# Patient Record
Sex: Female | Born: 1965 | Race: White | Hispanic: No | Marital: Married | Smoking: Never smoker
Health system: Southern US, Community
[De-identification: ages and names within clinical notes are randomized; demographics above are authoritative.]

## PROBLEM LIST (undated history)

## (undated) DIAGNOSIS — N201 Calculus of ureter: Secondary | ICD-10-CM

## (undated) DIAGNOSIS — Z85828 Personal history of other malignant neoplasm of skin: Secondary | ICD-10-CM

## (undated) DIAGNOSIS — R112 Nausea with vomiting, unspecified: Secondary | ICD-10-CM

## (undated) DIAGNOSIS — R519 Headache, unspecified: Secondary | ICD-10-CM

## (undated) DIAGNOSIS — N200 Calculus of kidney: Secondary | ICD-10-CM

## (undated) DIAGNOSIS — Z87442 Personal history of urinary calculi: Secondary | ICD-10-CM

## (undated) DIAGNOSIS — N12 Tubulo-interstitial nephritis, not specified as acute or chronic: Secondary | ICD-10-CM

## (undated) DIAGNOSIS — Z9889 Other specified postprocedural states: Secondary | ICD-10-CM

## (undated) DIAGNOSIS — Z860100 Personal history of colon polyps, unspecified: Secondary | ICD-10-CM

---

## 1999-06-16 ENCOUNTER — Other Ambulatory Visit: Admission: RE | Admit: 1999-06-16 | Discharge: 1999-06-16 | Payer: Self-pay | Admitting: Obstetrics and Gynecology

## 2000-07-04 ENCOUNTER — Other Ambulatory Visit: Admission: RE | Admit: 2000-07-04 | Discharge: 2000-07-04 | Payer: Self-pay | Admitting: Obstetrics and Gynecology

## 2001-01-28 ENCOUNTER — Other Ambulatory Visit: Admission: RE | Admit: 2001-01-28 | Discharge: 2001-01-28 | Payer: Self-pay | Admitting: Obstetrics and Gynecology

## 2001-04-18 ENCOUNTER — Encounter: Payer: Self-pay | Admitting: *Deleted

## 2001-04-18 ENCOUNTER — Encounter: Admission: RE | Admit: 2001-04-18 | Discharge: 2001-04-18 | Payer: Self-pay | Admitting: *Deleted

## 2001-05-05 ENCOUNTER — Encounter: Admission: RE | Admit: 2001-05-05 | Discharge: 2001-05-05 | Payer: Self-pay | Admitting: Urology

## 2001-05-05 ENCOUNTER — Encounter: Payer: Self-pay | Admitting: Urology

## 2001-08-15 ENCOUNTER — Inpatient Hospital Stay (HOSPITAL_COMMUNITY): Admission: AD | Admit: 2001-08-15 | Discharge: 2001-08-17 | Payer: Self-pay | Admitting: Obstetrics and Gynecology

## 2001-08-15 ENCOUNTER — Encounter (INDEPENDENT_AMBULATORY_CARE_PROVIDER_SITE_OTHER): Payer: Self-pay

## 2001-09-23 ENCOUNTER — Other Ambulatory Visit: Admission: RE | Admit: 2001-09-23 | Discharge: 2001-09-23 | Payer: Self-pay | Admitting: Obstetrics and Gynecology

## 2001-11-05 ENCOUNTER — Encounter: Admission: RE | Admit: 2001-11-05 | Discharge: 2001-11-05 | Payer: Self-pay | Admitting: Urology

## 2001-11-05 ENCOUNTER — Encounter: Payer: Self-pay | Admitting: Urology

## 2001-11-06 ENCOUNTER — Encounter: Payer: Self-pay | Admitting: Urology

## 2001-11-06 ENCOUNTER — Encounter: Admission: RE | Admit: 2001-11-06 | Discharge: 2001-11-06 | Payer: Self-pay | Admitting: Urology

## 2001-11-10 ENCOUNTER — Ambulatory Visit (HOSPITAL_BASED_OUTPATIENT_CLINIC_OR_DEPARTMENT_OTHER): Admission: RE | Admit: 2001-11-10 | Discharge: 2001-11-10 | Payer: Self-pay | Admitting: Urology

## 2001-11-10 ENCOUNTER — Encounter: Payer: Self-pay | Admitting: Urology

## 2001-11-13 ENCOUNTER — Encounter: Admission: RE | Admit: 2001-11-13 | Discharge: 2001-11-13 | Payer: Self-pay | Admitting: Urology

## 2001-11-13 ENCOUNTER — Encounter: Payer: Self-pay | Admitting: Urology

## 2001-12-18 ENCOUNTER — Encounter: Payer: Self-pay | Admitting: Urology

## 2001-12-18 ENCOUNTER — Encounter: Admission: RE | Admit: 2001-12-18 | Discharge: 2001-12-18 | Payer: Self-pay | Admitting: Urology

## 2002-03-05 ENCOUNTER — Encounter: Payer: Self-pay | Admitting: Urology

## 2002-03-05 ENCOUNTER — Ambulatory Visit (HOSPITAL_BASED_OUTPATIENT_CLINIC_OR_DEPARTMENT_OTHER): Admission: RE | Admit: 2002-03-05 | Discharge: 2002-03-05 | Payer: Self-pay | Admitting: Urology

## 2002-03-19 ENCOUNTER — Encounter: Payer: Self-pay | Admitting: Urology

## 2002-03-19 ENCOUNTER — Encounter: Admission: RE | Admit: 2002-03-19 | Discharge: 2002-03-19 | Payer: Self-pay | Admitting: Urology

## 2002-05-18 ENCOUNTER — Emergency Department (HOSPITAL_COMMUNITY): Admission: EM | Admit: 2002-05-18 | Discharge: 2002-05-18 | Payer: Self-pay | Admitting: Emergency Medicine

## 2002-08-05 ENCOUNTER — Other Ambulatory Visit: Admission: RE | Admit: 2002-08-05 | Discharge: 2002-08-05 | Payer: Self-pay | Admitting: Obstetrics and Gynecology

## 2002-10-19 HISTORY — PX: HYSTEROSCOPY WITH D & C: SHX1775

## 2003-02-12 ENCOUNTER — Inpatient Hospital Stay (HOSPITAL_COMMUNITY): Admission: AD | Admit: 2003-02-12 | Discharge: 2003-02-14 | Payer: Self-pay | Admitting: Obstetrics and Gynecology

## 2003-03-12 ENCOUNTER — Other Ambulatory Visit: Admission: RE | Admit: 2003-03-12 | Discharge: 2003-03-12 | Payer: Self-pay | Admitting: Obstetrics and Gynecology

## 2005-10-18 ENCOUNTER — Ambulatory Visit (HOSPITAL_COMMUNITY): Admission: RE | Admit: 2005-10-18 | Discharge: 2005-10-18 | Payer: Self-pay | Admitting: *Deleted

## 2005-10-18 ENCOUNTER — Encounter (INDEPENDENT_AMBULATORY_CARE_PROVIDER_SITE_OTHER): Payer: Self-pay | Admitting: *Deleted

## 2006-09-11 ENCOUNTER — Encounter (INDEPENDENT_AMBULATORY_CARE_PROVIDER_SITE_OTHER): Payer: Self-pay | Admitting: Specialist

## 2006-09-11 ENCOUNTER — Ambulatory Visit (HOSPITAL_COMMUNITY): Admission: RE | Admit: 2006-09-11 | Discharge: 2006-09-11 | Payer: Self-pay | Admitting: Obstetrics and Gynecology

## 2006-09-11 HISTORY — PX: OTHER SURGICAL HISTORY: SHX169

## 2008-03-03 ENCOUNTER — Ambulatory Visit (HOSPITAL_COMMUNITY): Admission: RE | Admit: 2008-03-03 | Discharge: 2008-03-03 | Payer: Self-pay | Admitting: Urology

## 2008-03-03 HISTORY — PX: OTHER SURGICAL HISTORY: SHX169

## 2008-03-11 ENCOUNTER — Ambulatory Visit (HOSPITAL_COMMUNITY): Admission: RE | Admit: 2008-03-11 | Discharge: 2008-03-11 | Payer: Self-pay | Admitting: Urology

## 2010-11-28 NOTE — Op Note (Signed)
NAME:  Leslie Fitzgerald, Leslie Fitzgerald                 ACCOUNT NO.:  [id number]   MEDICAL RECORD NO.:  [id number]          PATIENT TYPE:  AMB   LOCATION:  DAY                          FACILITY:  [facility]   PHYSICIAN:  Valetta Fuller, M.D.  DATE OF BIRTH:  1966-07-02   DATE OF PROCEDURE:  03/03/2008  DATE OF DISCHARGE:                               OPERATIVE REPORT   PREOPERATIVE DIAGNOSIS:  Left ureteral stone x2.   POSTOPERATIVE DIAGNOSIS:  Left ureteral stone x2.   PROCEDURE:  1. Cystourethroscopy.  2. Left retrograde pyelogram.  3. Intraoperative fluoroscopy with interpretation.  4. Left semi-rigid ureteroscopic stone manipulation with laser      lithotripsy.  5. Placement of left 6 x 24 contoured double-J stent.   ATTENDING PHYSICIAN:  Dr. Barron Alvine.   RESIDENT PHYSICIAN:  Dr. Delman Kitten.   ANESTHESIA:  General.   INDICATIONS FOR PROCEDURE:  Leslie Fitzgerald is a 45 year old white female  with past medical history positive for nephrolithiasis.  She has had  ESWL in the past but has never undergone an endoscopic therapy.  She  presented to Dr. Archer Asa a weeks ago complaining of left-sided flank  pain.  CT at that time demonstrated bilateral renal calculi with severe  left hydroureteronephrosis.  She has an 8 x 12-mm left UPJ stone as well  as a possible left 5 x 6-mm mid ureteral stone.  Because of these  findings and her symptoms, a combination of endoscopic evaluation with  possible ESWL was recommended.  Preoperatively, risks, benefits,  consequences and concerns were discussed with her, and informed consent  was obtained.   PROCEDURE IN DETAIL:  The patient was brought to the operating room and  placed in supine position.  She was correctly identified by wristband,  and appropriate time-out was taken.  IV antibiotics were administered,  and general anesthesia was delivered.  Once adequately anesthetized, she  was placed in dorsal lithotomy position with great care taken to  minimize  the risk of peripheral neuropathy or compartment syndrome.  Her  perineum was prepped and draped sterilely.  We began our procedure by  performing rigid cystourethroscopy with 22-French rigid cystoscopic  sheath and 12 degrees lens and normal saline as our irrigant.  Urethra  was normal in course and caliber.  Upon entering the bladder, clear  urine was identified.  Both ureteral orifices were noted to be normal in  anatomic position, effluxing clear urine.  There were no stones in the  bladder, and we did not appreciate any urothelial abnormality.  The left  ureteral orifice was gently cannulated with a 5-French open-ended  catheter, and left retrograde pyelogram demonstrated a filling defect at  the UVJ as well as a large filling defect in the left mid ureter.  The  left renal pelvis did not opacify very well.  Simple fluoroscopy  demonstrated the mid ureteral stone, and it is believed that this is the  UPJ stone that has migrated distally.  Based on the retrograde finding,  we elected to proceed forth with ureteroscopic evaluation of the distal  ureter with possible removal  of stone if present.  We then advanced a  sensor tip guidewire in the left ureteral orifice and directed up to the  level of the left renal pelvis using fluoroscopic guidance.  The  guidewire did meet some resistance at the level of the mid ureter where  the known stone was.  The ureter had somewhat of a kink in it, but  advancing the open-ended catheter over the guidewire helping stabilize  the distal ureter, this allowed [place] to easily advance the sensor tip  guidewire into the left renal pelvis.  The open-ended catheter was  removed as was the cystoscope.  We then performed left semi-rigid  ureteroscopy.  The distal ureter was widely capacious with the exception  the UVJ where it was somewhat narrow.  The distal 5- to 6-mm stone was  easily visualized and initially was grasped with a nitinol basket in an  attempt to  remove it en block.  However, due to this narrowing at the  UVJ, we elected to disengage the stone and advance a 360-nanometer laser  fiber through the semi-rigid ureteroscope with settings at 6 Hz and 0.6  joules.  We performed laser lithotripsy of the stone and broke it into  multiple small fragments which were then able to pass in antegrade  fashion into the bladder.  There were 2 larger fragments, and these were  grasped with the nitinol basket and removed.  The distal ureter was  clean at this point.  We then removed the ureteroscope and then back  loaded the cystoscope over the guidewire and successfully placed a left  6 x 24 contoured double-J stent with the proximal curl being in the left  renal pelvis and distal curl noted to be in the bladder.  Upon removal  of the guidewire, urine was seen using the vents from the stent  indicating function.  The bladder was then drained, the cystoscope was  removed, and Urojet was instilled per urethra.  This marked the end of  our procedure.  She tolerated the procedure well.  There were no  complications.  She awoke from anesthesia and was taken to recovery room  in stable condition.  Dr. Isabel Caprice was present and participated in all  aspects of the case.     ______________________________  Justice Rocher, M.D.  Electronically Signed    DW/MEDQ  D:  03/03/2008  T:  03/03/2008  Job:  [id number]

## 2010-12-01 NOTE — Discharge Summary (Signed)
   NAME:  Leslie Fitzgerald, Leslie Fitzgerald                           ACCOUNT NO.:  [id number]   MEDICAL RECORD NO.:  [id number]                   PATIENT TYPE:  INP   LOCATION:  9113                                 FACILITY:  [facility]   PHYSICIAN:  Lenoard Aden, M.D.             DATE OF BIRTH:  [birth year]   DATE OF ADMISSION:  02/12/2003  DATE OF DISCHARGE:  02/14/2003                                 DISCHARGE SUMMARY   HOSPITAL COURSE:  The patient underwent uncomplicated primary C-section for  a nonreassuring fetal heart tracing on February 12, 2003.  Postoperative course  uncomplicated.  Hemoglobin 9.4.  Discharged to home day #2.  Tolerated a  regular diet without difficulty, ambulated without difficulty.  Discharged  to home on Tylox and ferrous sulfate.  Discharge teaching done.  To follow  up in the office in one week for staple removal.                                               Lenoard Aden, M.D.    RJT/MEDQ  D:  03/01/2003  T:  03/01/2003  Job:  [id number]

## 2010-12-01 NOTE — H&P (Signed)
[facility]  Patient:    Leslie Fitzgerald, Leslie Fitzgerald Visit Number: [id number] MRN: [id number]          Service Type: OBS Location: 910B 9162 01 Attending Physician:  Maxie Better Dictated by:   Lenoard Aden, M.D. Admit Date:  08/15/2001   CC:         [facility]   History and Physical  CHIEF COMPLAINT:              Labor.  HISTORY OF PRESENT ILLNESS:   This is a 45 year old white female, G4 P2, EDD of August 17, 2001, at 39+ weeks, with polyhydramnios, for induction.  ALLERGIES:                    No known drug allergies.  MEDICATIONS:                  Prenatal vitamins.  PAST MEDICAL HISTORY:         1. SAB in 1994.                               2. Full term delivery, female, in 34.                               3. Female in 32.                               4. No other medical or surgical hospitalizations                                  except for kidney stones in pregnancy.                               5. Patient has history of LEEP in 1994.  FAMILY HISTORY:               Hypertension and heart disease.  PRENATAL LABORATORY DATA:     Blood type Rh-positive.  Rubella immune. Hepatitis B surface antigen negative.  HIV nonreactive.  REVIEW OF SYSTEMS:            Pregnancy complicated by preterm cervical change.  PHYSICAL EXAMINATION:  GENERAL:                      She is a well-developed, well-nourished white female, in no apparent distress.  HEENT:                        Normal.  LUNGS:                        Clear.  HEART:                        Regular rhythm.  ABDOMEN:                      Soft, gravid, nontender.  Estimated fetal weight 7-1/2 pounds.  PELVIC:                       Cervix is 4-5, 100%, vertex  0.  EXTREMITIES:                  No cords.  NEUROLOGIC:                   Nonfocal.  IMPRESSION:                   Term intrauterine pregnancy with polyhydramnios, in early labor, scheduled for  induction.  PLAN:                         Proceed with Pitocin augmentation as needed. Anticipated attempted vaginal delivery. Dictated by:   Lenoard Aden, M.D. Attending Physician:  Maxie Better DD:  08/16/01 TD:  08/16/01 Job: [id number]

## 2010-12-01 NOTE — Op Note (Signed)
NAME:  Leslie Fitzgerald, Leslie Fitzgerald                 ACCOUNT NO.:  [id number]   MEDICAL RECORD NO.:  [id number]          PATIENT TYPE:  AMB   LOCATION:  SDC                           FACILITY:  [facility]   PHYSICIAN:  Lenoard Aden, M.D.DATE OF BIRTH:  1966-01-18   DATE OF PROCEDURE:  09/11/2006  DATE OF DISCHARGE:                               OPERATIVE REPORT   PREOPERATIVE DIAGNOSIS:  Refractory menorrhagia.   POSTOPERATIVE DIAGNOSIS:  Refractory menorrhagia.   PROCEDURE:  Diagnostic hysteroscopy, dilatation and curettage, and  NovaSure endometrial ablation.   SURGEON:  Lenoard Aden, M.D.   ANESTHESIA:  General.   ESTIMATED BLOOD LOSS:  Less than 50 mL.   FLUID DEFICIT:  Less than 50 mL.   COMPLICATIONS:  None.   PATHOLOGY SPECIMEN:  Endometrial curettings to pathology.   The patient to recovery in good condition.   BRIEF OPERATIVE NOTE:  After being apprised of the risks of anesthesia,  infection, bleeding, injury to abdominal organs and need for repair,  delayed versus immediate complications to include bowel and bladder  injury with possible need for repair of uterine perforation, the patient  was brought to the operating room, where she was administered a general  anesthetic without complications, prepped and draped in the usual  sterile fashion.  Feet are placed in the yellow fin stirrups.  Exam  under anesthesia reveals a small midpositioned, anteflexed uterus.  No  adnexal masses are noted.  Dilute paracervical block with 2% Xylocaine  solution 20 mL placed.  Intracervical length of 4 cm.  Uterus sounds to  7 cm.  NovaSure device is set.  Hysteroscope is placed after dilating  the cervix up to a #23 Pratt dilator.  Visualization reveals a normal  endometrial cavity, bilateral normal uterine tubal ostia, and no  evidence of any lesions.  D&C is performed using sharp curettage in a  four-quadrant method.  Endometrial curettings are collected and sent to  pathology.  NovaSure  device is placed and is seated in the proper  fashion, set to the cavity width of 3.5.  CO2 test is negative, power is  set to 58 and procedure proceeds for 118 seconds without  difficulty in the standard fashion.  Revisualization hysteroscopically  reveals the cavity to be intact without evidence of perforation and well-  ablated globally.  At this time all instruments are removed.  Minimal  bleeding is noted.  The patient tolerates the procedure well  and is  transferred to recovery in good condition.      Lenoard Aden, M.D.  Electronically Signed     RJT/MEDQ  D:  09/11/2006  T:  09/11/2006  Job:  [id number]

## 2010-12-01 NOTE — H&P (Signed)
   NAME:  Leslie Fitzgerald, Leslie Fitzgerald                           ACCOUNT NO.:  [id number]   MEDICAL RECORD NO.:  [id number]                   PATIENT TYPE:  MAT   LOCATION:  MATC                                 FACILITY:  [facility]   PHYSICIAN:  Lenoard Aden, M.D.             DATE OF BIRTH:  08/24/1965   DATE OF ADMISSION:  02/12/2003  DATE OF DISCHARGE:                                HISTORY & PHYSICAL   CHIEF COMPLAINT:  Polyhydramnios.   HISTORY OF PRESENT ILLNESS:  The patient is a 45 year old white female, G5,  P3, EDD of February 27, 2003, at 38 weeks with worsening polyhydramnios with  an AFI of 32, for induction.  The patient's estimated fetal weight is in the  96th percentile as well.   PAST OBSTETRICAL HISTORY:  Remarkable for four previous pregnancies, an SAB  in 1994, SVD in 1995, 1998, and 2003.   ALLERGIES:  No known drug allergies.   MEDICATIONS:  Prenatal vitamins.   She has a history of urolithiasis.   Family history of hypertension and cardiovascular disease.   PRENATAL LABORATORY DATA:  Blood type of O positive, Rh antibody negative.  Rubella immune.  Hepatitis negative.  HIV declined.  Pregnancy course  complicated by preterm cervical change.  A normal amniocentesis for advanced  maternal age.   PHYSICAL EXAMINATION:  GENERAL:  She is a well-developed, well-nourished  white female in no acute distress.  HEENT:  Normal.  CHEST:  Lungs clear.  CARDIAC:  Regular rhythm.  ABDOMEN:  Soft, gravid, nontender.  Estimated fetal weight 8-1/2 pounds.  PELVIC:  Cervix is 2-3 cm, 90%, vertex, -2.  EXTREMITIES:  No cords.  NEUROLOGIC:  Nonfocal.   IMPRESSION:  Term intrauterine pregnancy with worsening polyhydramnios.   PLAN:  Proceed with induction.  Anticipate attempts at vaginal delivery.                                               Lenoard Aden, M.D.   RJT/MEDQ  D:  02/11/2003  T:  02/12/2003  Job:  [id number]

## 2010-12-01 NOTE — Op Note (Signed)
NAME:  Leslie Fitzgerald, Leslie Fitzgerald                 ACCOUNT NO.:  [id number]   MEDICAL RECORD NO.:  [id number]          PATIENT TYPE:  AMB   LOCATION:  SDC                           FACILITY:  [facility]   PHYSICIAN:  [place] B. Earlene Plater, M.D.  DATE OF BIRTH:  [birth year]   DATE OF PROCEDURE:  10/18/2005  DATE OF DISCHARGE:                                 OPERATIVE REPORT   PREOPERATIVE DIAGNOSIS:  Abnormal uterine bleeding.   POSTOPERATIVE DIAGNOSIS:  Abnormal uterine bleeding.   PROCEDURE:  Hysteroscopy, dilatation and curettage.   SURGEON:  Chester Holstein. Earlene Plater, M.D.   ASSISTANT:  None.   ANESTHESIA:  MAC and 20 mL of 1% Nesacaine paracervical block.   SPECIMENS:  Endometrial curettings.   ESTIMATED BLOOD LOSS:  Minimal.   FLUID DEFICIT:  Was 100 mL Sorbitol.   COMPLICATIONS:  None.   INDICATIONS FOR PROCEDURE:  The patient with a history of heavy and  irregular menstrual bleeding.  Saline infusion and ultrasound in the office  suggestive of a focal endometrial mass.  The patient was advised of the  risks of surgery, including bleeding, damage to tissues and/or organs and  uterine perforation and fluid overload.   FINDINGS:  Generally proliferative-appearing endometrium but no focal mass  seen.   DESCRIPTION OF PROCEDURE:  The patient is taken to the operating room and  MAC anesthesia obtained.  She was placed in the Leonidas stirrups.  Prepped and  draped in the standard fashion.  The bladder was emptied with the in and out  catheter.  Examination under anesthesia showed a normal-sized mid-plane  uterus with no adnexal masses.  The speculum inserted.  A paracervical block  placed.  A single tooth attached to the anterior lip of the cervix.  The  cervix was easily dilated to a number 21.  The diagnostic hysteroscope was  inserted, after being flushed with Sorbitol.  Good uterine distention  obtained.  The endometrial cavity was examined.  The tubal ostia both noted.  The endometrium was very  proliferative in its appearance; however, no focal  mass was seen.  The endometrium was gently curetted and Randall stone  forceps passed with no additional tissue returned.  The scope was re-  inserted and no abnormalities were seen, therefore the procedure was  terminated.  The instruments were removed.  The cervix was hemostatic.   The patient tolerated the procedure well with no complications.  She was  taken to the recovery room awake, alert and in stable condition.      Gerri Spore B. Earlene Plater, M.D.  Electronically Signed     WBD/MEDQ  D:  10/18/2005  T:  10/19/2005  Job:  [id number]

## 2010-12-01 NOTE — Op Note (Signed)
NAME:  Leslie Fitzgerald, Leslie Fitzgerald                           ACCOUNT NO.:  [id number]   MEDICAL RECORD NO.:  [id number]                   PATIENT TYPE:  INP   LOCATION:  9198                                 FACILITY:  [facility]   PHYSICIAN:  Lenoard Aden, M.D.             DATE OF BIRTH:  1966/02/12   DATE OF PROCEDURE:  02/12/2003  DATE OF DISCHARGE:                                 OPERATIVE REPORT   PREOPERATIVE DIAGNOSES:  1. Nonreassuring fetal heart rate tracing.  2. Polyhydramnios at 38 weeks'.   POSTOPERATIVE DIAGNOSES:  1. Nonreassuring fetal heart rate tracing.  2. Polyhydramnios at 38 weeks'.   PROCEDURE:  Primary low segment transverse cesarean section.   SURGEON:  Lenoard Aden, M.D.   ANESTHESIA:  Epidural by Burnett Corrente, M.D.   ESTIMATED BLOOD LOSS:  1,500 mL.   INSTRUMENT COUNT:  Correct.   COMPLICATIONS:  None.   DISPOSITION:  The patient to recovery in good condition.   FINDINGS:  Full-term living female, Apgars 8 and 79.  Cord pH pending.  Normal tubes and ovaries.  High riding bladder flap noted.   BRIEF OPERATIVE NOTE:  After being apprised of the risks of anesthesia,  infection, bleeding, injury to abdominal organs, need for repair, the  patient is brought to the operating room for nonreassuring fetal heart rate  tracing, prepped and draped in usual sterile fashion.  After achieving  adequate anesthesia, a Foley catheter placed.  Dilute Marcaine solution is  placed in the area.  A Pfannenstiel skin incision is made with a scalpel,  carried down to the fascia which is nicked in the midline and open  transversely using Mayo scissors.  Rectus muscles dissected sharply in the  midline.  The peritoneum entered sharply, a bladder blade placed.  Upon  placement of the bladder blade the bladder flap is highly adherent to the  middle part of the lower uterine segment and is dissected sharply off this  lower uterine segment.  A hysterotomy incision is made.  An  atraumatic  delivery of a full-term living female, 8 pounds, is handed to the  pediatricians in attendance.  Occiput anterior position, Apgars 8 and 9.  Cord blood collected.  Cord pH collected.  Placenta delivered manually  intact.  The uterus curetted using a dry lap pack and closed in two layers  using a 0 Monocryl suture.  At this time there are multiple sites of  bleeding.  The right lateral aspect of the incision is closed by two  placements of an O'Leary stitch to control bleeding.  The bladder flap is  continually oozing along its reflection to the lower uterine segment.  This  is cauterized gently for good hemostasis and Surgicel is placed in the  midline.  There is bleeding from an area of the incision which is closed  using an interrupted suture.  The left lateral aspect of the  incision is  within normal limits.  There do not appear to be any  cervical lacerations during this incision.  Normal tubes and ovaries noted.  Good hemostasis achieved.  Irrigation is accomplished.  Fascia then closed  using a 0 Monocryl in continuous running fashion.  Skin closed using  staples.  The patient tolerates the procedure well and is transferred to  recovery in good condition.                                               Lenoard Aden, M.D.    RJT/MEDQ  D:  02/12/2003  T:  02/13/2003  Job:  [id number]

## 2011-03-21 ENCOUNTER — Ambulatory Visit (HOSPITAL_BASED_OUTPATIENT_CLINIC_OR_DEPARTMENT_OTHER)
Admission: RE | Admit: 2011-03-21 | Discharge: 2011-03-21 | Disposition: A | Discharge status: Home / Self Care | Payer: BC Managed Care – PPO | Source: Ambulatory Visit | Attending: Urology | Admitting: Urology

## 2011-03-21 DIAGNOSIS — N201 Calculus of ureter: Secondary | ICD-10-CM | POA: Insufficient documentation

## 2011-03-28 ENCOUNTER — Ambulatory Visit (HOSPITAL_BASED_OUTPATIENT_CLINIC_OR_DEPARTMENT_OTHER)
Admission: RE | Admit: 2011-03-28 | Discharge: 2011-03-28 | Disposition: A | Discharge status: Home / Self Care | Payer: BC Managed Care – PPO | Source: Ambulatory Visit | Attending: Urology | Admitting: Urology

## 2011-03-28 DIAGNOSIS — Z01812 Encounter for preprocedural laboratory examination: Secondary | ICD-10-CM | POA: Insufficient documentation

## 2011-03-28 DIAGNOSIS — N201 Calculus of ureter: Secondary | ICD-10-CM | POA: Insufficient documentation

## 2011-03-28 HISTORY — PX: OTHER SURGICAL HISTORY: SHX169

## 2011-03-28 LAB — POCT HEMOGLOBIN-HEMACUE: Hemoglobin: 14.8 g/dL (ref 12.0–15.0)

## 2011-04-05 NOTE — Op Note (Signed)
  NAME:  Leslie Fitzgerald, Leslie Fitzgerald NO.:  [id number]  MEDICAL RECORD NO.:  [id number]  LOCATION:                               FACILITY:  [facility]  PHYSICIAN:  Valetta Fuller, MD    DATE OF BIRTH:  [birth year]  DATE OF PROCEDURE:  03/28/2011 DATE OF DISCHARGE:                              OPERATIVE REPORT   PREOPERATIVE DIAGNOSIS:  Right ureteral calculus.  POSTOPERATIVE DIAGNOSIS:  Right ureteral calculus.  PROCEDURE PERFORMED: 1. Cystoscopy. 2. Right ureteroscopy. 3. Holmium laser lithotripsy. 4. Basketing of fragments.  SURGEON:  Valetta Fuller, MD  ANESTHESIA:  General.  INDICATIONS:  [name] is 45 years of age.  She has had substantial recurrent nephrolithiasis and was recently diagnosed with the distal right ureteral calculus.  She has been attempting to pass the stone now for quite some time unsuccessfully.  She has continued to have intermittent discomfort and lower urinary symptoms.  We discussed options with her and she elected to proceed with definitive management. She presents now for that.  She appears to understand the advantages and disadvantages of that.  TECHNIQUE AND FINDINGS:  The patient was brought to the operating room where she had successful induction of general anesthesia.  She was placed in lithotomy position and prepped, draped in usual manner.  She received perioperative antibiotics.  Appropriate surgical time-out was performed.  Cystoscopy revealed an inflamed right hemi trigone.  A small portion of the stone could be seen __________ from the right ureteral orifice.  With manipulation, a guidewire was able to get passed beyond the stone to the right renal pelvis with fluoroscopic guidance.  We then inserted the 6.5 French rigid ureteroscope.  We went up to the ureteral orifice where a holmium laser fiber was used to start to fragment the stone.  We then slowly engaged the distal ureter and continue to treat the stone until it had been  broken up into 18-20 pieces.  Several of the larger fragments were basket extracted with a nitinol basket.  There was some mild inflammation of the intramural ureter, but nothing really significant.  No evidence of ureteral injury.  We felt it would be reasonable to attempt to manage her without a double-J stent given the lack of need for dilation and lack of any complications or problems with ureteroscopy. For that reason, the guidewire was removed.  Her bladder was drained and she was brought to recovery room in stable condition.     Valetta Fuller, MD     DSG/MEDQ  D:  03/29/2011  T:  03/29/2011  Job:  [id number]  Electronically Signed by Barron Alvine M.D. on 04/05/2011 04:53:52 PM

## 2012-02-14 ENCOUNTER — Encounter (HOSPITAL_BASED_OUTPATIENT_CLINIC_OR_DEPARTMENT_OTHER): Payer: Self-pay | Admitting: *Deleted

## 2012-02-14 ENCOUNTER — Other Ambulatory Visit: Payer: Self-pay | Admitting: Urology

## 2012-02-15 ENCOUNTER — Encounter (HOSPITAL_BASED_OUTPATIENT_CLINIC_OR_DEPARTMENT_OTHER): Payer: Self-pay | Admitting: *Deleted

## 2012-02-15 NOTE — Progress Notes (Addendum)
NPO AFTER MN. ARRIVES AT 1030. NEEDS ISTAT 8 AND URINE PREG. MAY TAKE VICODIN AM OF SURG W/ SIP OF WATER.  CALLED DR Isabel Caprice OFFICE AND QUESTIONED CBC AND BMET ORDERS, SINCE PT HX DOES NOT SHOW ANY ISSUES.  ASKED IF ISTAT WAS OK OR IF THERE IS SOMETHING IN DR GRAPEY HX THAT I AM NOT AWARE OF, SINCE NO H&P  IN EPIC YET.  RECEIVED FAX FROM  DR Isabel Caprice WITH ANSWER, OK TO DO ISTAT INSTEAD OF CBC/BMT BUT HE DID WANT AN ACCURATE RENAL FUNCTION.  SO, I D/C'D LABS AND ENTERED ISTAT 8 , THIS WILL GET HIM WHAT HE NEEDS.

## 2012-02-17 NOTE — H&P (Signed)
Reason For Visit   Preston returns for a follow-up visit. I have reviewed her most recent imaging studies and went through the note from [facility]. She did have a positive urine culture for Pseudomonas but really had minimal to no significant symptoms other than a little bit of suprapubic pressure. She took the Cipro for 4 or 5 days but then stopped it because of concerns over photosensitivity since she was out in the sun. She continues to be relatively asymptomatic. She has had a bit of discomfort on her left side but nothing that has been severe. The patient had another KUB today. She has 7-9 stones in her distal to mid ureter on the left. I did not see any renal calculi. All these stones appear to be in the 3-5 mm size range.       History of Present Illness        46 y/o white female with h/o nephrolithiasis presents for acute evaluation LUTS onoing for the past several weeks but worse over the past week.  She reports increased frequency, suprapubic pressure and terminal hesitancy as well as intermittent twinges of flank pains.  She deneis persistent flank pain, N/V, fever or chills. She is leaving to go out of town Sat. 01/19/12 and wanted to make sure she did not have a UTI.   She has recenlty passed an 8mm right distal ureteral stone in 07/2011.  On KUB 06/2011, it appeared that she had 1-2 left proximal ureteral stones as well and she is not aware that she passed either of these but has not had any persistent or significant flank pains to suggest obstruction.  She is s/p cystoscopy with HLL and right distal ureteral stone extraction 03/28/11. She has known bilateral nephrolithiasis.   Past Medical History Problems  1. History of  Urinary Calculus 592.9  Surgical History Problems  1. History of  Cesarean Section 2. History of  Cystoscopy With Insertion Of Ureteral Stent Left 3. History of  Cystoscopy With Ureteroscopy With Lithotripsy 4. History of  Cystoscopy With Ureteroscopy With  Lithotripsy 5. History of  Lithotripsy 6. History of  Lithotripsy  Current Meds 1. Maxalt-MLT 10 MG Oral Tablet Dispersible; Therapy: 14Jun2011 to 2. Tamsulosin HCl 0.4 MG Oral Capsule; TAKE 1 CAPSULE Daily to facilitate stone passage;  Therapy: 01Jul2013 to (Evaluate:29Sep2013); Last Rx:01Jul2013  Allergies Medication  1. HYDROmorphone HCl TABS  Family History Problems  1. Maternal history of  Acute Myocardial Infarction V17.3 2. Maternal history of  Death In The Family Mother 3. Paternal history of  Family Health Status - Father's Age age 25 4. Maternal history of  Family Health Status - Mother's Age age 81 from "poor circulation" 5. Family history of  Family Health Status Number Of Children 1 son and 3 daughters  Social History Problems  1. Caffeine Use 2 daily 2. Marital History - Currently Married 3. Never A Smoker 4. 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  Genitourinary: hematuria, but no dysuria.  Gastrointestinal: nausea, flank pain and abdominal pain.  Constitutional: feeling tired (fatigue), but no fever and no night sweats.  Musculoskeletal: back pain.  Neurological: headache.    Vitals Vital Signs [Data Includes: Last 1 Day]  01Aug2013 09:01AM  Blood Pressure: 110 / 77 Temperature: 98.5 F Heart Rate: 76  Physical Exam Constitutional: Well nourished Amended By: Barron Alvine; 02/17/2012  8:33 PMEST  and well developed Amended By: Barron Alvine; 02/17/2012 8:33 PMEST . No acute distress. Amended By: Barron Alvine; 02/17/2012 8:33 PMEST.  ENT:. The ears and nose are normal in appearance. Amended By: Barron Alvine; 02/17/2012 8:33 PMEST.  Pulmonary: No respiratory distress Amended By: Barron Alvine;  02/17/2012 8:34 PMEST  and normal respiratory rhythm and effort Amended By: Barron Alvine; 02/17/2012 8:34 PMEST.  Cardiovascular: Heart rate and rhythm are normal Amended By: Barron Alvine; 02/17/2012 8:33 PMEST . No peripheral edema. Amended By: Barron Alvine; 02/17/2012 8:33 PMEST.  Abdomen: The abdomen is soft and nontender Amended By: Barron Alvine; 02/17/2012 8:33 PMEST No masses are palpated Amended By: Barron Alvine; 02/17/2012 8:33 PMEST No CVA tenderness Amended By: Barron Alvine; 02/17/2012 8:33 PMEST. No hernias are palpable Amended By: Barron Alvine; 02/17/2012 8:33 PMEST No hepatosplenomegaly noted Amended By: Barron Alvine; 02/17/2012 8:33 PMEST  Neuro/Psych:. Mood and affect are appropriate. Amended By: Barron Alvine; 02/17/2012 8:34 PMEST.    Results/Data Urine [Data Includes: Last 1 Day]   01Aug2013  COLOR YELLOW   APPEARANCE CLEAR   SPECIFIC GRAVITY 1.015   pH 6.0   GLUCOSE NEG mg/dL  BILIRUBIN NEG   KETONE NEG mg/dL  BLOOD MOD   PROTEIN NEG mg/dL  UROBILINOGEN 0.2 mg/dL  NITRITE NEG   LEUKOCYTE ESTERASE MOD   SQUAMOUS EPITHELIAL/HPF NONE SEEN   WBC 11-20 WBC/hpf  RBC 0-2 RBC/hpf  BACTERIA RARE   CRYSTALS NONE SEEN   CASTS NONE SEEN    Assessment Assessed  1. Pyuria 791.9 2. Distal Ureteral Stone On The Left 592.1 3. Nephrolithiasis 592.0  Plan Health Maintenance (V70.0)  1. UA With REFLEX  Done: 01Aug2013 08:35AM Pyuria (791.9)  2. Levofloxacin 500 MG Oral Tablet; TAKE 1 TABLET DAILY; Therapy: 01Aug2013 to  (Evaluate:15Aug2013); Last Rx:01Aug2013 3. RENAL U/S LEFT  Done: 01Aug2013 12:00AM 4. URINE CULTURE  Done: 01Aug2013 Ureteral Stone (592.1)  5. Follow-up Schedule Surgery Office  Follow-up  Requested for: 01Aug2013  Discussion/Summary   The real key syndrome issue here for Leslie Fitzgerald is to determining if she has ongoing active infection and then how we are going to manage her substantial left ureteral stone burden. An ultrasound will be done today to  see the degree of hydronephrosis she has. The more hydronephrotic her kidney is, the more urgent I feel it is going to be necessary to get a double-J stent in her and make some attempts at definitive stone management. Obviously, we need to make sure that the infection is under control/resolved before embarking on either ESWL or ureteroscopy. If she does get into clinical problems, then a stent would be the first step. Otherwise, if we can clear her infection and she does well clinically, we can make an attempt at starting with ureteroscopy knowing that it may take multiple procedures to clear her completely. This was all discussed with her. We will get a limited left ultrasound and then plan ahead from there.    A total of 30 minutes were spent in the overall care of the patient today with 30 minutes in direct face to face consultation.    Amendment   Deauna did undergo limited left renal ultrasonography. She was noted to have severe hydronephrosis of the left kidney with a dilated proximal ureter. Again, I do think we are going to need to intervene sooner rather than later. Repeat urine culture done today with initiation of Levaquin. We will plan on next week taking her to surgery for retrograde pyelogram. If she  is doing reasonably well clinically, we can make an initial attempt at ureteroscopy and try to remove a part or potentially all of the stone burden if feasible. Most importantly, however, we need to be sure that we do obtain good drainage of that kidney with double-J stent placement and may need to come back at a later date for more definitive stone work.       Signatures Electronically signed by : Barron Alvine, M.D.; Feb 17 2012  8:34PM

## 2012-02-18 ENCOUNTER — Encounter (HOSPITAL_BASED_OUTPATIENT_CLINIC_OR_DEPARTMENT_OTHER): Payer: Self-pay | Admitting: *Deleted

## 2012-02-18 ENCOUNTER — Ambulatory Visit (HOSPITAL_BASED_OUTPATIENT_CLINIC_OR_DEPARTMENT_OTHER)
Admission: RE | Admit: 2012-02-18 | Discharge: 2012-02-18 | Disposition: A | Discharge status: Home / Self Care | Payer: BC Managed Care – PPO | Source: Ambulatory Visit | Attending: Urology | Admitting: Urology

## 2012-02-18 ENCOUNTER — Ambulatory Visit (HOSPITAL_BASED_OUTPATIENT_CLINIC_OR_DEPARTMENT_OTHER): Payer: BC Managed Care – PPO | Admitting: Anesthesiology

## 2012-02-18 ENCOUNTER — Encounter (HOSPITAL_BASED_OUTPATIENT_CLINIC_OR_DEPARTMENT_OTHER)
Admission: RE | Disposition: A | Discharge status: Home / Self Care | Payer: Self-pay | Source: Ambulatory Visit | Attending: Urology

## 2012-02-18 ENCOUNTER — Encounter (HOSPITAL_BASED_OUTPATIENT_CLINIC_OR_DEPARTMENT_OTHER): Payer: Self-pay | Admitting: Anesthesiology

## 2012-02-18 DIAGNOSIS — Z79899 Other long term (current) drug therapy: Secondary | ICD-10-CM | POA: Insufficient documentation

## 2012-02-18 DIAGNOSIS — N201 Calculus of ureter: Secondary | ICD-10-CM

## 2012-02-18 HISTORY — DX: Personal history of urinary calculi: Z87.442

## 2012-02-18 HISTORY — PX: CYSTOSCOPY W/ URETERAL STENT PLACEMENT: SHX1429

## 2012-02-18 HISTORY — PX: URETEROSCOPY: SHX842

## 2012-02-18 LAB — POCT I-STAT, CHEM 8
BUN: 19 mg/dL (ref 6–23)
Calcium, Ion: 1.28 mmol/L — ABNORMAL HIGH (ref 1.12–1.23)
Chloride: 106 mEq/L (ref 96–112)
Creatinine, Ser: 1.1 mg/dL (ref 0.50–1.10)
Glucose, Bld: 92 mg/dL (ref 70–99)
HCT: 43 % (ref 36.0–46.0)
Potassium: 3.8 mEq/L (ref 3.5–5.1)

## 2012-02-18 SURGERY — CYSTOSCOPY, WITH RETROGRADE PYELOGRAM AND URETERAL STENT INSERTION
Anesthesia: General | Site: Ureter | Laterality: Left | Wound class: Clean Contaminated

## 2012-02-18 MED ORDER — GENTAMICIN SULFATE 40 MG/ML IJ SOLN
120.0000 mg | INTRAVENOUS | Status: AC
  Administered 2012-02-18: 120 mg via INTRAVENOUS

## 2012-02-18 MED ORDER — URIBEL 118 MG PO CAPS: 1.0000 | ORAL_CAPSULE | Freq: Three times a day (TID) | ORAL | Status: DC | PRN

## 2012-02-18 MED ORDER — KETOROLAC TROMETHAMINE 30 MG/ML IJ SOLN: 15.0000 mg | Freq: Once | INTRAMUSCULAR | Status: DC | PRN

## 2012-02-18 MED ORDER — MIDAZOLAM HCL 5 MG/5ML IJ SOLN
INTRAMUSCULAR | Status: DC | PRN
  Administered 2012-02-18: 2 mg via INTRAVENOUS

## 2012-02-18 MED ORDER — FENTANYL CITRATE 0.05 MG/ML IJ SOLN
INTRAMUSCULAR | Status: DC | PRN
  Administered 2012-02-18: 12.5 ug via INTRAVENOUS
  Administered 2012-02-18: 50 ug via INTRAVENOUS
  Administered 2012-02-18: 25 ug via INTRAVENOUS
  Administered 2012-02-18: 100 ug via INTRAVENOUS
  Administered 2012-02-18: 12.5 ug via INTRAVENOUS

## 2012-02-18 MED ORDER — PROPOFOL 10 MG/ML IV EMUL
INTRAVENOUS | Status: DC | PRN
  Administered 2012-02-18: 200 mg via INTRAVENOUS

## 2012-02-18 MED ORDER — ONDANSETRON HCL 4 MG/2ML IJ SOLN
INTRAMUSCULAR | Status: DC | PRN
  Administered 2012-02-18: 4 mg via INTRAVENOUS

## 2012-02-18 MED ORDER — PROMETHAZINE HCL 25 MG/ML IJ SOLN: 6.2500 mg | INTRAMUSCULAR | Status: DC | PRN

## 2012-02-18 MED ORDER — KETOROLAC TROMETHAMINE 30 MG/ML IJ SOLN
INTRAMUSCULAR | Status: DC | PRN
  Administered 2012-02-18: 30 mg via INTRAVENOUS

## 2012-02-18 MED ORDER — IOHEXOL 350 MG/ML SOLN
INTRAVENOUS | Status: DC | PRN
  Administered 2012-02-18: 18 mL via INTRAVENOUS

## 2012-02-18 MED ORDER — FENTANYL CITRATE 0.05 MG/ML IJ SOLN
25.0000 ug | INTRAMUSCULAR | Status: DC | PRN
  Administered 2012-02-18 (×2): 25 ug via INTRAVENOUS

## 2012-02-18 MED ORDER — LIDOCAINE HCL (CARDIAC) 20 MG/ML IV SOLN
INTRAVENOUS | Status: DC | PRN
  Administered 2012-02-18: 80 mg via INTRAVENOUS

## 2012-02-18 MED ORDER — URELLE 81 MG PO TABS
1.0000 | ORAL_TABLET | Freq: Four times a day (QID) | ORAL | Status: DC
  Administered 2012-02-18: 81 mg via ORAL

## 2012-02-18 MED ORDER — DIPHENHYDRAMINE HCL 25 MG PO CAPS: 25.0000 mg | ORAL_CAPSULE | Freq: Four times a day (QID) | ORAL | Status: DC | PRN

## 2012-02-18 MED ORDER — SODIUM CHLORIDE 0.9 % IR SOLN
Status: DC | PRN
  Administered 2012-02-18: 6000 mL

## 2012-02-18 MED ORDER — HYDROCODONE-ACETAMINOPHEN 5-325 MG PO TABS: 1.0000 | ORAL_TABLET | Freq: Four times a day (QID) | ORAL | Status: AC | PRN

## 2012-02-18 MED ORDER — LACTATED RINGERS IV SOLN
INTRAVENOUS | Status: DC
  Administered 2012-02-18: 100 mL/h via INTRAVENOUS
  Administered 2012-02-18: 13:00:00 via INTRAVENOUS

## 2012-02-18 MED ORDER — DEXAMETHASONE SODIUM PHOSPHATE 4 MG/ML IJ SOLN
INTRAMUSCULAR | Status: DC | PRN
  Administered 2012-02-18 (×2): 5 mg via INTRAVENOUS

## 2012-02-18 SURGICAL SUPPLY — 36 items
ADAPTER CATH URET PLST 4-6FR (CATHETERS) ×3 IMPLANT
BAG DRAIN URO-CYSTO SKYTR STRL (DRAIN) ×3 IMPLANT
BASKET LASER NITINOL 1.9FR (BASKET) IMPLANT
BASKET STNLS GEMINI 4WIRE 3FR (BASKET) IMPLANT
BASKET ZERO TIP NITINOL 2.4FR (BASKET) ×3 IMPLANT
BENZOIN TINCTURE PRP APPL 2/3 (GAUZE/BANDAGES/DRESSINGS) IMPLANT
CANISTER SUCT LVC 12 LTR MEDI- (MISCELLANEOUS) ×3 IMPLANT
CATH INTERMIT  6FR 70CM (CATHETERS) ×3 IMPLANT
CATH URET 5FR 28IN CONE TIP (BALLOONS)
CATH URET 5FR 28IN OPEN ENDED (CATHETERS) IMPLANT
CATH URET 5FR 70CM CONE TIP (BALLOONS) IMPLANT
CLOTH BEACON ORANGE TIMEOUT ST (SAFETY) ×3 IMPLANT
DRAPE CAMERA CLOSED 9X96 (DRAPES) ×3 IMPLANT
DRSG TEGADERM 2-3/8X2-3/4 SM (GAUZE/BANDAGES/DRESSINGS) IMPLANT
GLOVE BIO SURGEON STRL SZ 6.5 (GLOVE) ×3 IMPLANT
GLOVE BIO SURGEON STRL SZ7.5 (GLOVE) ×3 IMPLANT
GLOVE ECLIPSE 6.0 STRL STRAW (GLOVE) ×3 IMPLANT
GOWN PREVENTION PLUS XLARGE (GOWN DISPOSABLE) ×3 IMPLANT
GOWN STRL NON-REIN LRG LVL3 (GOWN DISPOSABLE) ×3 IMPLANT
GOWN STRL REIN XL XLG (GOWN DISPOSABLE) ×3 IMPLANT
GUIDEWIRE 0.038 PTFE COATED (WIRE) IMPLANT
GUIDEWIRE ANG ZIPWIRE 038X150 (WIRE) IMPLANT
GUIDEWIRE STR DUAL SENSOR (WIRE) ×3 IMPLANT
IV NS IRRIG 3000ML ARTHROMATIC (IV SOLUTION) ×6 IMPLANT
KIT BALLIN UROMAX 15FX10 (LABEL) IMPLANT
KIT BALLN UROMAX 15FX4 (MISCELLANEOUS) IMPLANT
KIT BALLN UROMAX 26 75X4 (MISCELLANEOUS)
LASER FIBER DISP (UROLOGICAL SUPPLIES) ×3 IMPLANT
NS IRRIG 500ML POUR BTL (IV SOLUTION) IMPLANT
PACK CYSTOSCOPY (CUSTOM PROCEDURE TRAY) ×3 IMPLANT
SET HIGH PRES BAL DIL (LABEL)
SHEATH ACCESS URETERAL 38CM (SHEATH) IMPLANT
SHEATH ACCESS URETERAL 54CM (SHEATH) IMPLANT
SHEATH URET ACCESS 12FR/35CM (UROLOGICAL SUPPLIES) IMPLANT
SHEATH URET ACCESS 12FR/55CM (UROLOGICAL SUPPLIES) IMPLANT
STENT URET 6FRX24 CONTOUR (STENTS) ×3 IMPLANT

## 2012-02-18 NOTE — Anesthesia Preprocedure Evaluation (Signed)

## 2012-02-18 NOTE — Anesthesia Procedure Notes (Signed)
Procedure Name: LMA Insertion Date/Time: 02/18/2012 11:55 AM Performed by: Norva Pavlov Pre-anesthesia Checklist: Patient identified, Emergency Drugs available, Suction available and Patient being monitored Patient Re-evaluated:Patient Re-evaluated prior to inductionOxygen Delivery Method: Circle System Utilized Preoxygenation: Pre-oxygenation with 100% oxygen Intubation Type: IV induction Ventilation: Mask ventilation without difficulty LMA: LMA inserted LMA Size: 4.0 Number of attempts: 1 Airway Equipment and Method: bite block Placement Confirmation: positive ETCO2 Tube secured with: Tape Dental Injury: Teeth and Oropharynx as per pre-operative assessment

## 2012-02-18 NOTE — Anesthesia Postprocedure Evaluation (Signed)
Anesthesia Post Note  Patient: Leslie Fitzgerald  Procedure(s) Performed: Procedure(s) (LRB): CYSTOSCOPY WITH RETROGRADE PYELOGRAM/URETERAL STENT PLACEMENT (Left) URETEROSCOPY (Left) HOLMIUM LASER APPLICATION (Left)  Anesthesia type: General  Patient location: PACU  Post pain: Pain level controlled  Post assessment: Post-op Vital signs reviewed  Last Vitals:  Filed Vitals:   02/18/12 1305  BP:   Pulse: 88  Temp:   Resp: 9    Post vital signs: Reviewed  Level of consciousness: sedated  Complications: No apparent anesthesia complications

## 2012-02-18 NOTE — Progress Notes (Signed)
Dr Isabel Caprice paged-order received regarding taking benadryl 25 mg po  with hydrocodone at home

## 2012-02-18 NOTE — Op Note (Signed)
Preoperative diagnosis: Multiple (put 8-10) ureteral calculi  Postoperative diagnosis: Same  Procedure:  1. Cystoscopy 2. Left ureteroscopy and stone removal 3. Ureteroscopic laser lithotripsy 4. Left ureteral stent placement (6 [place]) 24 cm with dangle string 5. Left retrograde pyelography with interpretation  Surgeon: Valetta Fuller, MD  Anesthesia: General  Complications: None  Intraoperative findings: Left retrograde pyelography demonstrated multiple filling defects within the left ureter ureter consistent with the patient's known calculi without other abnormalities.  EBL: Minimal  Specimens: 1. Left ureteral stone fragments  Disposition of specimens: [facility] for stone analysis  Indication: Leslie Fitzgerald is a 46 y.o.   patient with urolithiasis. After reviewing the management options for treatment, the patient elected to proceed with the above surgical procedure(s). We have discussed the potential benefits and risks of the procedure, side effects of the proposed treatment, the likelihood of the patient achieving the goals of the procedure, and any potential problems that might occur during the procedure or recuperation. Informed consent has been obtained.  Description of procedure:  The patient was taken to the operating room and general anesthesia was induced.  The patient was placed in the dorsal lithotomy position, prepped and draped in the usual sterile fashion, and preoperative antibiotics were administered. A preoperative time-out was performed.   Cystourethroscopy was performed.   The bladder was then systematically examined in its entirety. There was no evidence for any bladder tumors, stones, or other mucosal pathology.    Attention then turned to the left ureteral orifice and a ureteral catheter was used to intubate the ureteral orifice.  Omnipaque contrast was injected through the ureteral catheter and a retrograde pyelogram was performed with  findings as dictated above.  A 0.38 sensor guidewire was then advanced up the left ureter into the renal pelvis under fluoroscopic guidance. The 6 Fr semirigid ureteroscope was then advanced into the ureter next to the guidewire and the calculus was identified.   The stones were then fragmented with the 365 micron holmium laser fiber on a setting of 0.5 J and frequency of 12 Hz.   All stones were then removed from the ureter with a zero tip nitinol basket.  Reinspection of the ureter revealed no remaining visible stones or fragments.   The wire was then backloaded through the cystoscope and a ureteral stent was advance over the wire using Seldinger technique.  The stent was positioned appropriately under fluoroscopic and cystoscopic guidance.  The wire was then removed with an adequate stent curl noted in the renal pelvis as well as in the bladder.  The bladder was then emptied and the procedure ended.  The patient appeared to tolerate the procedure well and without complications.  The patient was able to be awakened and transferred to the recovery unit in satisfactory condition.

## 2012-02-18 NOTE — Interval H&P Note (Signed)
History and Physical Interval Note:  02/18/2012 11:27 AM  Leslie Fitzgerald  has presented today for surgery, with the diagnosis of LEFT URETERAL CALCULI, LEFT HYDRONEPHOSIS  The various methods of treatment have been discussed with the patient and family. After consideration of risks, benefits and other options for treatment, the patient has consented to  Procedure(s) (LRB): CYSTOSCOPY WITH RETROGRADE PYELOGRAM/URETERAL STENT PLACEMENT (Left) URETEROSCOPY (Left) HOLMIUM LASER APPLICATION (Left) as a surgical intervention .  The patient's history has been reviewed, patient examined, no change in status, stable for surgery.  I have reviewed the patient's chart and labs.  Questions were answered to the patient's satisfaction.     Kristene Liberati S

## 2012-02-18 NOTE — Transfer of Care (Signed)
Immediate Anesthesia Transfer of Care Note  Patient: Leslie Fitzgerald  Procedure(s) Performed: Procedure(s) (LRB): CYSTOSCOPY WITH RETROGRADE PYELOGRAM/URETERAL STENT PLACEMENT (Left) URETEROSCOPY (Left) HOLMIUM LASER APPLICATION (Left)  Patient Location: PACU  Anesthesia Type: General  Level of Consciousness: awake, alert  and oriented  Airway & Oxygen Therapy: Patient Spontanous Breathing and Patient connected to face mask oxygen  Post-op Assessment: Report given to PACU RN and Post -op Vital signs reviewed and stable  Post vital signs: Reviewed and stable  Complications: No apparent anesthesia complications

## 2012-02-19 ENCOUNTER — Encounter (HOSPITAL_BASED_OUTPATIENT_CLINIC_OR_DEPARTMENT_OTHER): Payer: Self-pay | Admitting: Urology

## 2012-02-21 ENCOUNTER — Encounter (HOSPITAL_BASED_OUTPATIENT_CLINIC_OR_DEPARTMENT_OTHER): Payer: Self-pay

## 2013-09-13 HISTORY — PX: COLONOSCOPY W/ POLYPECTOMY: SHX1380

## 2013-09-21 ENCOUNTER — Ambulatory Visit (INDEPENDENT_AMBULATORY_CARE_PROVIDER_SITE_OTHER): Payer: 59 | Admitting: Internal Medicine

## 2013-09-21 ENCOUNTER — Encounter: Payer: Self-pay | Admitting: Internal Medicine

## 2013-09-21 VITALS — BP 110/68 | HR 80 | Ht 62.0 in | Wt 179.0 lb

## 2013-09-21 DIAGNOSIS — R9389 Abnormal findings on diagnostic imaging of other specified body structures: Secondary | ICD-10-CM

## 2013-09-21 DIAGNOSIS — R109 Unspecified abdominal pain: Secondary | ICD-10-CM

## 2013-09-21 MED ORDER — MOVIPREP 100 G PO SOLR
1.0000 | Freq: Once | ORAL | Status: DC
Start: 2013-09-21 — End: 2013-09-21

## 2013-09-21 MED ORDER — MOVIPREP 100 G PO SOLR: 1.0000 | Freq: Once | ORAL | Status: DC

## 2013-09-21 NOTE — Progress Notes (Signed)
HISTORY OF PRESENT ILLNESS:  Leslie Fitzgerald is a 48 y.o. female with a history of recurrent kidney stones. She is sent by her urologist, Dr. Risa Grill, regarding abnormal findings on abdominal CT. She was recently evaluated for right-sided/flank pain consistent with her kidney stones. CT of the abdomen and pelvis without contrast was performed 09/16/2013. This has been reviewed. There was concern regarding wall thickening along the left side of the transverse colon with associated prominence of the pericolonic fat and vasculature in this region. She is now referred for further evaluation. Patient's GI review of systems is remarkable for chronic constipation, for years, for which she takes cascara daily. Otherwise negative GI review of systems. No true abdominal pain, bleeding, or weight loss. No family history of colon cancer  REVIEW OF SYSTEMS:  All non-GI ROS negative except for flank pain, urinary leakage, transient insomnia  Past Medical History  Diagnosis Date  . History of kidney stones   . Left ureteral calculus x7 stones  . Hydronephrosis, left   . Skin cancer     Past Surgical History  Procedure Laterality Date  . Right ureteroscopic stone extractions  03-28-2011  . Cysto/ left retrograde pyelogram/ left ureteroscopic laser litho/ stone extractions  03-03-2008  . D & c hysteroscopy/ novasure endometrial ablation  09-11-2006    REFACTORY MENORRHAGIA  . Hysteroscopy w/d&c  10-19-2002    AUB  . Cesarean section  02-12-2003    PRIMARY  . Cystoscopy w/ ureteral stent placement  02/18/2012    Procedure: CYSTOSCOPY WITH RETROGRADE PYELOGRAM/URETERAL STENT PLACEMENT;  Surgeon: Bernestine Amass, MD;  [facility];  Service: Urology;  Laterality: Left;  1 HR   . Ureteroscopy  02/18/2012    Procedure: URETEROSCOPY;  Surgeon: Bernestine Amass, MD;  Location: [facility];  Service: Urology;  Laterality: Left;    Social History DYLLAN KATS  reports that she  has never smoked. She has never used smokeless tobacco. She reports that she drinks alcohol. She reports that she does not use illicit drugs.  family history includes Breast cancer in her sister; Heart attack in her father; Skin cancer in her father and sister.  No Known Allergies     PHYSICAL EXAMINATION: Vital signs: BP 110/68  Pulse 80  Ht 5\' 2"  (1.575 m)  Wt 179 lb (81.194 kg)  BMI 32.73 kg/m2  LMP 08/30/2013  Constitutional: generally well-appearing, no acute distress Psychiatric: alert and oriented x3, cooperative Eyes: extraocular movements intact, anicteric, conjunctiva pink Mouth: oral pharynx moist, no lesions Neck: supple no lymphadenopathy Cardiovascular: heart regular rate and rhythm, no murmur Lungs: clear to auscultation bilaterally Abdomen: soft, nontender, nondistended, no obvious ascites, no peritoneal signs, normal bowel sounds, no organomegaly Rectal: Deferred until colonoscopy Extremities: no lower extremity edema bilaterally Skin: no lesions on visible extremities Neuro: No focal deficits.   ASSESSMENT:  #1. Recent CT scan of the abdomen revealing focal thickening of the transverse colon of uncertain etiology #2. History of kidney stones    PLAN:  #1. Colonoscopy.The nature of the procedure, as well as the risks, benefits, and alternatives were carefully and thoroughly reviewed with the patient. Ample time for discussion and questions allowed. The patient understood, was satisfied, and agreed to proceed. Movi prep prescribed. Patient instructed on its use

## 2013-09-21 NOTE — Patient Instructions (Signed)
You have been scheduled for a colonoscopy with propofol. Please follow written instructions given to you at your visit today.  Please pick up your prep kit at the pharmacy TODAY. If you use inhalers (even only as needed), please bring them with you on the day of your procedure. Your physician has requested that you go to www.[email or web address] and enter the access code given to you at your visit today. This web site gives a general overview about your procedure. However, you should still follow specific instructions given to you by our office regarding your preparation for the procedure.                                                We are excited to introduce MyChart, a new best-in-class service that provides you online access to important information in your electronic medical record. We want to make it easier for you to view your health information - all in one secure location - when and where you need it. We expect MyChart will enhance the quality of care and service we provide.  When you register for MyChart, you can:    View your test results.    Request appointments and receive appointment reminders via email.    Request medication renewals.    View your medical history, allergies, medications and immunizations.    Communicate with your physician's office through a password-protected site.    Conveniently print information such as your medication lists.  To find out if MyChart is right for you, please talk to a member of our clinical staff today. We will gladly answer your questions about this free health and wellness tool.  If you are age 49 or older and want a member of your family to have access to your record, you must provide written consent by completing a proxy form available at our office. Please speak to our clinical staff about guidelines regarding accounts for patients younger than age 64.  As you activate your MyChart account and need any technical assistance, please call  the MyChart technical support line at [phone]-[phone]) or email your question to [email or web address]. If you email your question(s), please include your name, a return phone number and the best time to reach you.  If you have non-urgent health-related questions, you can send a message to our office through [place] at [email or web address]. If you have a medical emergency, call 911.  Thank you for using MyChart as your new health and wellness resource!   MyChart licensed from [employer],  1999-2010. Patents Pending.

## 2013-09-22 ENCOUNTER — Ambulatory Visit (AMBULATORY_SURGERY_CENTER): Payer: 59 | Admitting: Internal Medicine

## 2013-09-22 ENCOUNTER — Encounter: Payer: Self-pay | Admitting: Internal Medicine

## 2013-09-22 VITALS — BP 124/81 | HR 75 | Temp 98.4°F | Resp 17 | Ht 62.0 in | Wt 179.0 lb

## 2013-09-22 DIAGNOSIS — D126 Benign neoplasm of colon, unspecified: Secondary | ICD-10-CM

## 2013-09-22 DIAGNOSIS — R9389 Abnormal findings on diagnostic imaging of other specified body structures: Secondary | ICD-10-CM

## 2013-09-22 DIAGNOSIS — R109 Unspecified abdominal pain: Secondary | ICD-10-CM

## 2013-09-22 MED ORDER — SODIUM CHLORIDE 0.9 % IV SOLN: 500.0000 mL | INTRAVENOUS | Status: DC

## 2013-09-22 NOTE — Op Note (Signed)
[place]  [address] [place], [address]   COLONOSCOPY PROCEDURE REPORT  PATIENT: Leslie Fitzgerald, Leslie Fitzgerald  MR#: [id number] BIRTHDATE: [birth year] , 31  yrs. old GENDER: Female ENDOSCOPIST: Eustace Quail, MD REFERRED ON:GEXBM Grapey, M.D. PROCEDURE DATE:  09/22/2013 PROCEDURE:   Colonoscopy with snare polypectomy x 1 First Screening Colonoscopy - Avg.  risk and is 50 yrs.  old or older - No.  Prior Negative Screening - Now for repeat screening. N/A  History of Adenoma - Now for follow-up colonoscopy & has been > or = to 3 yrs.  N/A  Polyps Removed Today? Yes. ASA CLASS:   Class I INDICATIONS:an abnormal CT. MEDICATIONS: MAC sedation, administered by CRNA and propofol (Diprivan) 360mg  IV  DESCRIPTION OF PROCEDURE:   After the risks benefits and alternatives of the procedure were thoroughly explained, informed consent was obtained.  A digital rectal exam revealed no abnormalities of the rectum.   The LB [id number]  endoscope was introduced through the anus and advanced to the cecum, which was identified by both the appendix and ileocecal valve. No adverse events experienced.   The quality of the prep was excellent, using MoviPrep  The instrument was then slowly withdrawn as the colon was fully examined.   COLON FINDINGS: Melanosis was found throughout the entire examined colon.   A diminutive polyp was found in the ascending colon.  A polypectomy was performed with a cold snare.  The resection was complete and the polyp tissue was completely retrieved.   The colon mucosa was otherwise normal.  Retroflexed views revealed no abnormalities. The time to cecum=2 minutes 41 seconds.  Withdrawal time=10 minutes 51 seconds.  The scope was withdrawn and the procedure completed. COMPLICATIONS: There were no complications.  ENDOSCOPIC IMPRESSION: 1.   Melanosis was found throughout the entire examined colon 2.   Diminutive polyp was found in the ascending colon;  polypectomy was performed with a cold snare 3.   The colon mucosa was otherwise normal  RECOMMENDATIONS: 1. Repeat colonoscopy in 5 years if polyp adenomatous; otherwise 10 years   eSigned:  Eustace Quail, MD 09/22/2013 9:11 AM   cc: Rana Snare, MD, Juanda Crumble Record, MD, and The Patient

## 2013-09-22 NOTE — Patient Instructions (Signed)
YOU HAD AN ENDOSCOPIC PROCEDURE TODAY AT THE [facility]: Refer to the procedure report that was given to you for any specific questions about what was found during the examination.  If the procedure report does not answer your questions, please call your gastroenterologist to clarify.  If you requested that your care partner not be given the details of your procedure findings, then the procedure report has been included in a sealed envelope for you to review at your convenience later.  YOU SHOULD EXPECT: Some feelings of bloating in the abdomen. Passage of more gas than usual.  Walking can help get rid of the air that was put into your GI tract during the procedure and reduce the bloating. If you had a lower endoscopy (such as a colonoscopy or flexible sigmoidoscopy) you may notice spotting of blood in your stool or on the toilet paper. If you underwent a bowel prep for your procedure, then you may not have a normal bowel movement for a few days.  DIET: Your first meal following the procedure should be a light meal and then it is ok to progress to your normal diet.  A half-sandwich or bowl of soup is an example of a good first meal.  Heavy or fried foods are harder to digest and may make you feel nauseous or bloated.  Likewise meals heavy in dairy and vegetables can cause extra gas to form and this can also increase the bloating.  Drink plenty of fluids but you should avoid alcoholic beverages for 24 hours.  ACTIVITY: Your care partner should take you home directly after the procedure.  You should plan to take it easy, moving slowly for the rest of the day.  You can resume normal activity the day after the procedure however you should NOT DRIVE or use heavy machinery for 24 hours (because of the sedation medicines used during the test).    SYMPTOMS TO REPORT IMMEDIATELY: A gastroenterologist can be reached at any hour.  During normal business hours, 8:30 AM to 5:00 PM Monday through Friday,  call [phone].  After hours and on weekends, please call the GI answering service at [phone] who will take a message and have the physician on call contact you.   Following lower endoscopy (colonoscopy or flexible sigmoidoscopy):  Excessive amounts of blood in the stool  Significant tenderness or worsening of abdominal pains  Swelling of the abdomen that is new, acute  Fever of 100F or higher    FOLLOW UP: If any biopsies were taken you will be contacted by phone or by letter within the next 1-3 weeks.  Call your gastroenterologist if you have not heard about the biopsies in 3 weeks.  Our staff will call the home number listed on your records the next business day following your procedure to check on you and address any questions or concerns that you may have at that time regarding the information given to you following your procedure. This is a courtesy call and so if there is no answer at the home number and we have not heard from you through the emergency physician on call, we will assume that you have returned to your regular daily activities without incident.  SIGNATURES/CONFIDENTIALITY: You and/or your care partner have signed paperwork which will be entered into your electronic medical record.  These signatures attest to the fact that that the information above on your After Visit Summary has been reviewed and is understood.  Full responsibility of the confidentiality   of this discharge information lies with you and/or your care-partner.     

## 2013-09-22 NOTE — Progress Notes (Signed)
Report to pacu rn, vss, bbs=clear 

## 2013-09-22 NOTE — Progress Notes (Signed)
Called to room to assist during endoscopic procedure.  Patient ID and intended procedure confirmed with present staff. Received instructions for my participation in the procedure from the performing physician.  

## 2013-09-23 ENCOUNTER — Telehealth: Payer: Self-pay | Admitting: *Deleted

## 2013-09-23 NOTE — Telephone Encounter (Signed)
Message left

## 2013-09-28 ENCOUNTER — Encounter: Payer: Self-pay | Admitting: Internal Medicine

## 2014-03-02 ENCOUNTER — Other Ambulatory Visit: Payer: Self-pay | Admitting: Urology

## 2014-03-02 ENCOUNTER — Inpatient Hospital Stay (HOSPITAL_COMMUNITY)
Admission: AD | Admit: 2014-03-02 | Discharge: 2014-03-04 | DRG: 694 | Disposition: A | Discharge status: Home / Self Care | Payer: 59 | Source: Ambulatory Visit | Attending: Urology | Admitting: Urology

## 2014-03-02 ENCOUNTER — Encounter (HOSPITAL_COMMUNITY): Payer: 59 | Admitting: Anesthesiology

## 2014-03-02 ENCOUNTER — Encounter: Payer: Self-pay | Admitting: Urology

## 2014-03-02 ENCOUNTER — Encounter (HOSPITAL_COMMUNITY)
Admission: AD | Disposition: A | Discharge status: Home / Self Care | Payer: Self-pay | Source: Ambulatory Visit | Attending: Urology

## 2014-03-02 ENCOUNTER — Inpatient Hospital Stay (HOSPITAL_COMMUNITY): Payer: 59 | Admitting: Anesthesiology

## 2014-03-02 ENCOUNTER — Encounter (HOSPITAL_COMMUNITY): Payer: Self-pay

## 2014-03-02 DIAGNOSIS — N1330 Unspecified hydronephrosis: Secondary | ICD-10-CM | POA: Diagnosis present

## 2014-03-02 DIAGNOSIS — N201 Calculus of ureter: Secondary | ICD-10-CM | POA: Diagnosis present

## 2014-03-02 DIAGNOSIS — N132 Hydronephrosis with renal and ureteral calculous obstruction: Secondary | ICD-10-CM | POA: Diagnosis present

## 2014-03-02 HISTORY — PX: CYSTOSCOPY WITH URETEROSCOPY AND STENT PLACEMENT: SHX6377

## 2014-03-02 HISTORY — DX: Other specified postprocedural states: Z98.890

## 2014-03-02 HISTORY — DX: Nausea with vomiting, unspecified: R11.2

## 2014-03-02 LAB — CBC WITH DIFFERENTIAL/PLATELET
Basophils Absolute: 0 K/uL (ref 0.0–0.1)
Basophils Relative: 0 % (ref 0–1)
Eosinophils Absolute: 0 K/uL (ref 0.0–0.7)
Eosinophils Relative: 0 % (ref 0–5)
HCT: 44 % (ref 36.0–46.0)
Hemoglobin: 15 g/dL (ref 12.0–15.0)
Lymphocytes Relative: 6 % — ABNORMAL LOW (ref 12–46)
Lymphs Abs: 0.6 K/uL — ABNORMAL LOW (ref 0.7–4.0)
MCH: 31.9 pg (ref 26.0–34.0)
MCHC: 34.1 g/dL (ref 30.0–36.0)
MCV: 93.6 fL (ref 78.0–100.0)
Monocytes Absolute: 0.6 K/uL (ref 0.1–1.0)
Monocytes Relative: 6 % (ref 3–12)
Neutro Abs: 9.1 K/uL — ABNORMAL HIGH (ref 1.7–7.7)
Neutrophils Relative %: 88 % — ABNORMAL HIGH (ref 43–77)
Platelets: 120 K/uL — ABNORMAL LOW (ref 150–400)
RBC: 4.7 MIL/uL (ref 3.87–5.11)
RDW: 12.6 % (ref 11.5–15.5)
WBC: 10.3 K/uL (ref 4.0–10.5)

## 2014-03-02 LAB — BASIC METABOLIC PANEL
Anion gap: 15 (ref 5–15)
BUN: 15 mg/dL (ref 6–23)
CALCIUM: 9.2 mg/dL (ref 8.4–10.5)
CO2: 24 mEq/L (ref 19–32)
Chloride: 97 mEq/L (ref 96–112)
Creatinine, Ser: 0.93 mg/dL (ref 0.50–1.10)
GFR calc Af Amer: 84 mL/min — ABNORMAL LOW (ref >90)
GFR calc non Af Amer: 73 mL/min — ABNORMAL LOW (ref >90)
Glucose, Bld: 100 mg/dL — ABNORMAL HIGH (ref 70–99)
Potassium: 4.4 mEq/L (ref 3.7–5.3)
Sodium: 136 mEq/L — ABNORMAL LOW (ref 137–147)

## 2014-03-02 LAB — MRSA PCR SCREENING: MRSA BY PCR: NEGATIVE

## 2014-03-02 SURGERY — CYSTOURETEROSCOPY, WITH STENT INSERTION
Anesthesia: General | Site: Abdomen | Laterality: Right

## 2014-03-02 MED ORDER — LACTATED RINGERS IV SOLN
INTRAVENOUS | Status: DC
  Administered 2014-03-02 – 2014-03-04 (×5): via INTRAVENOUS

## 2014-03-02 MED ORDER — PROMETHAZINE HCL 25 MG/ML IJ SOLN: 6.2500 mg | INTRAMUSCULAR | Status: DC | PRN

## 2014-03-02 MED ORDER — PROPOFOL 10 MG/ML IV BOLUS
INTRAVENOUS | Status: DC | PRN
  Administered 2014-03-02: 150 mg via INTRAVENOUS

## 2014-03-02 MED ORDER — PROPOFOL 10 MG/ML IV BOLUS
INTRAVENOUS | Status: AC
  Filled 2014-03-02: qty 20

## 2014-03-02 MED ORDER — HYDROMORPHONE HCL PF 1 MG/ML IJ SOLN
0.5000 mg | INTRAMUSCULAR | Status: DC | PRN
  Administered 2014-03-02 (×2): 1 mg via INTRAVENOUS
  Filled 2014-03-02 (×2): qty 1

## 2014-03-02 MED ORDER — OXYCODONE HCL 5 MG PO TABS: 5.0000 mg | ORAL_TABLET | Freq: Once | ORAL | Status: DC | PRN

## 2014-03-02 MED ORDER — ACETAMINOPHEN 10 MG/ML IV SOLN
1000.0000 mg | Freq: Once | INTRAVENOUS | Status: AC
  Administered 2014-03-02: 1000 mg via INTRAVENOUS
  Filled 2014-03-02 (×2): qty 100

## 2014-03-02 MED ORDER — MEPERIDINE HCL 25 MG/ML IJ SOLN: 6.2500 mg | INTRAMUSCULAR | Status: DC | PRN

## 2014-03-02 MED ORDER — SODIUM CHLORIDE 0.9 % IR SOLN
Status: DC | PRN
  Administered 2014-03-02: 3000 mL

## 2014-03-02 MED ORDER — PHENYLEPHRINE HCL 10 MG/ML IJ SOLN
INTRAMUSCULAR | Status: DC | PRN
  Administered 2014-03-02 (×2): 80 ug via INTRAVENOUS

## 2014-03-02 MED ORDER — OXYCODONE HCL 5 MG PO TABS
5.0000 mg | ORAL_TABLET | ORAL | Status: DC | PRN
  Administered 2014-03-02 – 2014-03-04 (×5): 5 mg via ORAL
  Filled 2014-03-02 (×5): qty 1

## 2014-03-02 MED ORDER — PHENYLEPHRINE 40 MCG/ML (10ML) SYRINGE FOR IV PUSH (FOR BLOOD PRESSURE SUPPORT)
PREFILLED_SYRINGE | INTRAVENOUS | Status: AC
  Filled 2014-03-02: qty 10

## 2014-03-02 MED ORDER — DOCUSATE SODIUM 100 MG PO CAPS
100.0000 mg | ORAL_CAPSULE | Freq: Two times a day (BID) | ORAL | Status: DC
  Administered 2014-03-02 – 2014-03-03 (×3): 100 mg via ORAL
  Filled 2014-03-02 (×6): qty 1

## 2014-03-02 MED ORDER — FENTANYL CITRATE 0.05 MG/ML IJ SOLN
INTRAMUSCULAR | Status: DC | PRN
  Administered 2014-03-02: 50 ug via INTRAVENOUS

## 2014-03-02 MED ORDER — SENNA 8.6 MG PO TABS
1.0000 | ORAL_TABLET | Freq: Two times a day (BID) | ORAL | Status: DC
  Administered 2014-03-02 – 2014-03-03 (×3): 8.6 mg via ORAL
  Filled 2014-03-02 (×3): qty 1

## 2014-03-02 MED ORDER — LIDOCAINE HCL (CARDIAC) 20 MG/ML IV SOLN
INTRAVENOUS | Status: DC | PRN
  Administered 2014-03-02: 100 mg via INTRAVENOUS

## 2014-03-02 MED ORDER — HYDROMORPHONE HCL PF 1 MG/ML IJ SOLN: 0.2500 mg | INTRAMUSCULAR | Status: DC | PRN

## 2014-03-02 MED ORDER — ONDANSETRON HCL 4 MG/2ML IJ SOLN
INTRAMUSCULAR | Status: DC | PRN
  Administered 2014-03-02: 4 mg via INTRAVENOUS

## 2014-03-02 MED ORDER — MIDAZOLAM HCL 2 MG/2ML IJ SOLN
INTRAMUSCULAR | Status: AC
  Filled 2014-03-02: qty 2

## 2014-03-02 MED ORDER — IOHEXOL 300 MG/ML  SOLN
INTRAMUSCULAR | Status: DC | PRN
  Administered 2014-03-02: 50 mL via INTRAVENOUS

## 2014-03-02 MED ORDER — OXYCODONE HCL 5 MG/5ML PO SOLN: 5.0000 mg | Freq: Once | ORAL | Status: DC | PRN

## 2014-03-02 MED ORDER — GENTAMICIN SULFATE 40 MG/ML IJ SOLN
7.0000 mg/kg | INTRAVENOUS | Status: DC
  Administered 2014-03-02 – 2014-03-03 (×2): 430 mg via INTRAVENOUS
  Filled 2014-03-02 (×2): qty 10.75

## 2014-03-02 MED ORDER — FENTANYL CITRATE 0.05 MG/ML IJ SOLN
INTRAMUSCULAR | Status: AC
  Filled 2014-03-02: qty 2

## 2014-03-02 MED ORDER — CIPROFLOXACIN IN D5W 400 MG/200ML IV SOLN
400.0000 mg | Freq: Two times a day (BID) | INTRAVENOUS | Status: DC
  Administered 2014-03-02 – 2014-03-04 (×4): 400 mg via INTRAVENOUS
  Filled 2014-03-02 (×4): qty 200

## 2014-03-02 MED ORDER — DEXAMETHASONE SODIUM PHOSPHATE 10 MG/ML IJ SOLN
INTRAMUSCULAR | Status: DC | PRN
  Administered 2014-03-02: 10 mg via INTRAVENOUS

## 2014-03-02 MED ORDER — ONDANSETRON HCL 4 MG/2ML IJ SOLN: 4.0000 mg | INTRAMUSCULAR | Status: DC | PRN

## 2014-03-02 MED ORDER — MIDAZOLAM HCL 5 MG/5ML IJ SOLN
INTRAMUSCULAR | Status: DC | PRN
  Administered 2014-03-02: 2 mg via INTRAVENOUS

## 2014-03-02 SURGICAL SUPPLY — 14 items
BAG URO CATCHER STRL LF (DRAPE) ×2 IMPLANT
BASKET LASER NITINOL 1.9FR (BASKET) IMPLANT
CATH INTERMIT  6FR 70CM (CATHETERS) IMPLANT
CLOTH BEACON ORANGE TIMEOUT ST (SAFETY) ×2 IMPLANT
DRAPE CAMERA CLOSED 9X96 (DRAPES) ×2 IMPLANT
GLOVE BIOGEL M STRL SZ7.5 (GLOVE) ×2 IMPLANT
GOWN STRL REUS W/TWL LRG LVL3 (GOWN DISPOSABLE) ×2 IMPLANT
GUIDEWIRE ANG ZIPWIRE 038X150 (WIRE) ×2 IMPLANT
GUIDEWIRE STR DUAL SENSOR (WIRE) IMPLANT
MANIFOLD NEPTUNE II (INSTRUMENTS) ×2 IMPLANT
PACK CYSTO (CUSTOM PROCEDURE TRAY) ×2 IMPLANT
STENT POLARIS 5FRX22 (STENTS) ×2 IMPLANT
TUBE FEEDING 8FR 16IN STR KANG (MISCELLANEOUS) ×2 IMPLANT
TUBING CONNECTING 10 (TUBING) ×2 IMPLANT

## 2014-03-02 NOTE — Op Note (Signed)
[name], Leslie Fitzgerald NO.:  [id number]  MEDICAL RECORD NO.:  [id number]  LOCATION:  2                         FACILITY:  [name]  PHYSICIAN:  Alexis Frock, MD     DATE OF BIRTH:  [birth year]  DATE OF PROCEDURE:  03/02/2014 DATE OF DISCHARGE:                              OPERATIVE REPORT   DIAGNOSES:  Right ureteral stone, fever, bacteriuria, impending urosepsis.  PROCEDURE: 1. Cystoscopy with right retrograde pyelogram interpretation. 2. Right ureteral stent placement 5 x 22, no tether.  ESTIMATED BLOOD LOSS:  Nil.  COMPLICATIONS:  None.  SPECIMENS:  None.  INDICATIONS:  Leslie Fitzgerald is a 48 year old female with a long history of recurrent nephrolithiasis status post multiple spontaneous passage episodes and multiple procedures.  She presented to the Urology office this morning with a prodrome of fever and malaise, right flank pain. She underwent urgent CT scan, which corroborated right distal ureteral stone.  Given the symptoms was consistent with impacted stone, she was promptly admitted to the hospital where she had IV antibiotics began immediately and posting for right ureteral stent placement 8 hours after last meal.  Informed consent was obtained and placed in medical record.  PROCEDURE IN DETAIL:  The patient being Leslie Fitzgerald was verified. Procedure being cysto-right stent placement was confirmed.  Procedure was carried out.  Time-out was performed.  Intravenous antibiotics were administered.  General LMA anesthesia was introduced.  The patient was placed into a low lithotomy position.  Sterile field was created by prepping and draping the patient's vagina, introitus, and proximal thighs using iodine x3.  Next, cystourethroscopy was performed using a 22-French rigid cystoscope with 12-degree offset lens.  Inspection of the urinary bladder revealed mild cystitis changes with some scant erythema and edema of the urinary bladder.  Ureteral orifice  was in normal anatomic position.  The right ureter was cannulated with a 6- [place] Foley catheter and right retrograde pyelogram was obtained.  Right retrograde pyelogram demonstrated single right ureter, single system right kidney.  There was filling defect in distal ureter consistent with known stone.  There was mild hydroureteronephrosis above this.  Retrograde pyelogram was performed in a very gentle fashion to avoid pelvic venous backflow, which did not occur grossly.  A 0.038 zip wire was advanced at the level of the upper pole at which a new 5 x 22 Polaris-type stent was placed.  Good proximal and distal deployment were noted.  There was efflux of cloudy and bloody-appearing urine through the distal end of the stent.  Bladder was emptied per cystoscope, procedure terminated.  The patient tolerated the procedure well.  There were no immediate periprocedural complications.  The patient was taken to the postanesthesia care unit in stable condition.          ______________________________ Alexis Frock, MD     TM/MEDQ  D:  03/02/2014  T:  03/02/2014  Job:  [id number]

## 2014-03-02 NOTE — Anesthesia Postprocedure Evaluation (Signed)
Anesthesia Post Note  Patient: Leslie Fitzgerald  Procedure(s) Performed: Procedure(s) (LRB): CYSTOSCOPY WITH RIGHT RETROGRADE AND STENT PLACEMENT (Right)  Anesthesia type: General  Patient location: PACU  Post pain: Pain level controlled  Post assessment: Post-op Vital signs reviewed  Last Vitals: BP 112/55  Pulse 120  Temp(Src) 39.2 C (Oral)  Resp 20  Ht 5\' 2"  (1.575 m)  Wt 172 lb 11.2 oz (78.336 kg)  BMI 31.58 kg/m2  SpO2 94%  LMP 08/30/2013  Post vital signs: Reviewed  Level of consciousness: sedated  Complications: No apparent anesthesia complications

## 2014-03-02 NOTE — Anesthesia Preprocedure Evaluation (Signed)
Anesthesia Evaluation  Patient identified by MRN, date of birth, ID band Patient awake    Reviewed: Allergy & Precautions, H&P , NPO status , Patient's Chart, lab work & pertinent test results  Airway Mallampati: II TM Distance: <3 FB Neck ROM: Full    Dental no notable dental hx.    Pulmonary neg pulmonary ROS,  breath sounds clear to auscultation  Pulmonary exam normal       Cardiovascular negative cardio ROS  Rhythm:Regular Rate:Normal     Neuro/Psych negative neurological ROS  negative psych ROS   GI/Hepatic negative GI ROS, Neg liver ROS,   Endo/Other  negative endocrine ROS  Renal/GU Renal disease  negative genitourinary   Musculoskeletal negative musculoskeletal ROS (+)   Abdominal   Peds negative pediatric ROS (+)  Hematology negative hematology ROS (+)   Anesthesia Other Findings   Reproductive/Obstetrics negative OB ROS                           Anesthesia Physical  Anesthesia Plan  ASA: I  Anesthesia Plan: General   Post-op Pain Management:    Induction: Intravenous  Airway Management Planned: LMA  Additional Equipment:   Intra-op Plan:   Post-operative Plan:   Informed Consent: I have reviewed the patients History and Physical, chart, labs and discussed the procedure including the risks, benefits and alternatives for the proposed anesthesia with the patient or authorized representative who has indicated his/her understanding and acceptance.   Dental advisory given  Plan Discussed with: CRNA and Surgeon  Anesthesia Plan Comments:         Anesthesia Quick Evaluation

## 2014-03-02 NOTE — Progress Notes (Signed)
ANTIBIOTIC CONSULT NOTE - INITIAL  Pharmacy Consult for gentamicin Indication: pyelonephritis  No Known Allergies  Patient Measurements: Height: 5\' 2"  (157.5 cm) Weight: 172 lb 11.2 oz (78.336 kg) IBW/kg (Calculated) : 50.1  Vital Signs: Temp: 99.2 F (37.3 C) (08/18 1243) Temp src: Oral (08/18 1243) BP: 117/65 mmHg (08/18 1243) Pulse Rate: 104 (08/18 1243) Intake/Output from previous day:   Intake/Output from this shift:    Labs:  Recent Labs  03/02/14 1321  WBC 10.3  HGB 15.0  PLT 120*  CREATININE 0.93   Estimated Creatinine Clearance: 73.3 ml/min (by C-G formula based on Cr of 0.93). No results found for this basename: VANCOTROUGH, VANCOPEAK, VANCORANDOM, GENTTROUGH, GENTPEAK, GENTRANDOM, TOBRATROUGH, TOBRAPEAK, TOBRARND, AMIKACINPEAK, AMIKACINTROU, AMIKACIN,  in the last 72 hours   Microbiology: No results found for this or any previous visit (from the past 720 hour(s)).  Medical History: Past Medical History  Diagnosis Date  . History of kidney stones   . Left ureteral calculus x7 stones  . Hydronephrosis, left   . Skin cancer   . PONV (postoperative nausea and vomiting)     Medications:  Scheduled:  . ciprofloxacin  400 mg Intravenous Q12H  . docusate sodium  100 mg Oral BID  . senna  1 tablet Oral BID   Infusions:  . lactated ringers     Assessment: 48 yo female presents with R ureteral stone and pyelonephritis after several days of malaise and fever of 101.5 with right flank pain. Previous 05/15 urine culture grew enterococcus sensitive to Levaquin. Patient with hx recurrent nephrolithiasis. Plan per urology for IV abx with Cipro and gentamicin and renal decompression with neph tube or stent followed by definitive stone treatment in elective setting after clears infection. Urine culture obtained in urology office today.   8/18 >> Cipro 400mg  IV q12 >> 8/18 >> gentamicin >>   Afebrile  WBC WNL  SCr 0.93, CrCl 73  Urine culture taken at  urology office  Goal of Therapy:  per hartford nomogram  Plan:  1) Using adjusted weight of 61 kg due to obesity, start gentamicin 7mg /kg (430mg ) IV q24 2) Check a random gentamicin level 10 hrs after gentamicin dose started which should be around 1am tonight and will adjust dose based on hartford nomogram per protocol   Adrian Saran, PharmD, BCPS Pager [phone] 03/02/2014 2:01 PM

## 2014-03-02 NOTE — H&P (Signed)
Leslie Fitzgerald is an 48 y.o. female.    Chief Complaint: Rt Ureteral Stone and Pyelonephritis  HPI:   1 - Recurrent Nephrolithiasis -  Pre 2015 - many episodes colic managed with medical therapy x many, SWL x several, URS x1 . Compositions mostly CaOx 09/2013 - CT scattered bilateral renal stones 02/2014 -   2 - Acute Rt Flank Pain, Fever, Leslie Fitzgerald - Pt with several days malaise and fever to 101.5 with right flank pain. No dysuria. Most recnet UCX 11/2013 enterococcus sens leqvaquin. Last meal 8:30AM today. CT today with 74mm Rt distal ureteral stone with hydro, SSD 12cm, 900 HU. No left hydro / left ureteral stone.  PMH sig for cesarean. No CV disease. No strong blood thinners. Her PCP is Leslie Crumble Record MD in [place].  Today Leslie Fitzgerald is seen as urgent work in for above. She normally follows with Dr. Risa Grill.   Past Medical History  Diagnosis Date  . History of kidney stones   . Left ureteral calculus x7 stones  . Hydronephrosis, left   . Skin cancer     Past Surgical History  Procedure Laterality Date  . Right ureteroscopic stone extractions  03-28-2011  . Cysto/ left retrograde pyelogram/ left ureteroscopic laser litho/ stone extractions  03-03-2008  . D & c hysteroscopy/ novasure endometrial ablation  09-11-2006    REFACTORY MENORRHAGIA  . Hysteroscopy w/d&c  10-19-2002    AUB  . Cesarean section  02-12-2003    PRIMARY  . Cystoscopy w/ ureteral stent placement  02/18/2012    Procedure: CYSTOSCOPY WITH RETROGRADE PYELOGRAM/URETERAL STENT PLACEMENT;  Surgeon: Bernestine Amass, MD;  Location[facility];  Service: Urology;  Laterality: Left;  1 HR   . Ureteroscopy  02/18/2012    Procedure: URETEROSCOPY;  Surgeon: Bernestine Amass, MD;  Location: [facility];  Service: Urology;  Laterality: Left;    Family History  Problem Relation Age of Onset  . Breast cancer Sister   . Skin cancer Sister   . Skin cancer Father   . Heart attack Father   . Colon  cancer Neg Hx   . Stomach cancer Neg Hx    Social History:  reports that she has never smoked. She has never used smokeless tobacco. She reports that she drinks alcohol. She reports that she does not use illicit drugs.  Allergies: No Known Allergies   (Not in a hospital admission)  No results found for this or any previous visit (from the past 48 hour(s)). No results found.  Review of Systems  Constitutional: Positive for fever, chills and malaise/fatigue.  HENT: Negative.   Eyes: Negative.   Cardiovascular: Negative.   Gastrointestinal: Negative.   Genitourinary: Positive for flank pain.  Musculoskeletal: Positive for myalgias.  Skin: Negative.   Neurological: Negative.   Endo/Heme/Allergies: Negative.   Psychiatric/Behavioral: Negative.     There were no vitals taken for this visit. Physical Exam  Constitutional: She appears well-developed and well-nourished.  HENT:  Head: Normocephalic and atraumatic.  Eyes: EOM are normal. Pupils are equal, round, and reactive to light.  Neck: Normal range of motion. Neck supple.  Cardiovascular:  tachycardia  Respiratory: Effort normal.  GI: Soft.  Genitourinary:  Mild Rt CVAT  Musculoskeletal: Normal range of motion.  Neurological: She is alert.  Skin: Skin is warm and dry.  Psychiatric: She has a normal mood and affect. Her behavior is normal. Judgment and thought content normal.     Assessment/Plan  1 - Recurrent Nephrolithiasis -  CT today with acute rt distal stone.  2 - Rt Flank Pain, Fever, Malaise - Discussed urgent priority is IV ABX and renal decompression with neph tube or stent followed by definitive stone treatment in elective setting after clears infection. Admit now, cysto rt retrograde and rt ureteral stent placement today. UCX obtained today in office.  Risks including bleeding, infection, damange to kidney / ureteter / bladder, non-cure, need for further surgery discussed.   Leslie Fitzgerald 03/02/2014, 11:51  AM

## 2014-03-02 NOTE — Transfer of Care (Signed)
Immediate Anesthesia Transfer of Care Note  Patient: Leslie Fitzgerald  Procedure(s) Performed: Procedure(s): CYSTOSCOPY WITH RIGHT RETROGRADE AND STENT PLACEMENT (Right)  Patient Location: PACU  Anesthesia Type:General  Level of Consciousness: sedated  Airway & Oxygen Therapy: Patient Spontanous Breathing and Patient connected to face mask oxygen  Post-op Assessment: Report given to PACU RN and Post -op Vital signs reviewed and stable  Post vital signs: Reviewed and stable  Complications: No apparent anesthesia complications

## 2014-03-02 NOTE — Brief Op Note (Signed)
03/02/2014  4:59 PM  PATIENT:  Leslie Fitzgerald  48 y.o. female  PRE-OPERATIVE DIAGNOSIS:  kidney stone on right side   POST-OPERATIVE DIAGNOSIS:  kidney stone on right side   PROCEDURE:  Procedure(s): CYSTOSCOPY WITH RIGHT RETROGRADE AND STENT PLACEMENT (Right)  SURGEON:  Surgeon(s) and Role:    * Alexis Frock, MD - Primary  PHYSICIAN ASSISTANT:   ASSISTANTS: none   ANESTHESIA:   general  EBL:  Total I/O In: 912.4 [I.V.:601.7; IV Piggyback:310.8] Out: -   BLOOD ADMINISTERED:none  DRAINS: none   LOCAL MEDICATIONS USED:  NONE  SPECIMEN:  No Specimen  DISPOSITION OF SPECIMEN:  N/A  COUNTS:  YES  TOURNIQUET:  * No tourniquets in log *  DICTATION: .Other Dictation: Dictation Number [id number]  PLAN OF CARE: Admit to inpatient   PATIENT DISPOSITION:  PACU - hemodynamically stable.   Delay start of Pharmacological VTE agent (>24hrs) due to surgical blood loss or risk of bleeding: not applicable

## 2014-03-03 ENCOUNTER — Encounter (HOSPITAL_COMMUNITY): Payer: Self-pay | Admitting: Urology

## 2014-03-03 LAB — GENTAMICIN LEVEL, RANDOM: Gentamicin Rm: 3 ug/mL

## 2014-03-03 MED ORDER — ZOLPIDEM TARTRATE 5 MG PO TABS
5.0000 mg | ORAL_TABLET | Freq: Every evening | ORAL | Status: DC | PRN
  Administered 2014-03-03: 5 mg via ORAL
  Filled 2014-03-03: qty 1

## 2014-03-03 MED ORDER — BELLADONNA ALKALOIDS-OPIUM 16.2-60 MG RE SUPP
1.0000 | Freq: Four times a day (QID) | RECTAL | Status: DC | PRN
  Administered 2014-03-03: 1 via RECTAL
  Filled 2014-03-03: qty 1

## 2014-03-03 NOTE — Care Management Note (Addendum)
    Page 1 of 1   03/04/2014     11:26:38 AM CARE MANAGEMENT NOTE 03/04/2014  Patient:  Leslie Fitzgerald, Leslie Fitzgerald   Account Number:  [id number]  Date Initiated:  03/03/2014  Documentation initiated by:  Leafy Kindle  Subjective/Objective Assessment:   48 yo admitted r ureteral stone.     Action/Plan:   From home   Anticipated DC Date:  03/04/2014   Anticipated DC Plan:  [place]  CM consult      Choice offered to / List presented to:             Status of service:  Completed, signed off Medicare Important Message given?   (If response is "NO", the following Medicare IM given date fields will be blank) Date Medicare IM given:   Medicare IM given by:   Date Additional Medicare IM given:   Additional Medicare IM given by:    Discharge Disposition:  HOME/SELF CARE  Per UR Regulation:  Reviewed for med. necessity/level of care/duration of stay  If discussed at [place] of Stay Meetings, dates discussed:    Comments:  03/04/14 Millicent Blazejewski RN,BSN NCM [phone] D/C [place].  03/03/14 Marney Doctor RN, NCM [phone] SPOKE TO RN. PT HAD TEMP OVERNIGHT AND ON IV ABX.  MAY DC TOMORROW. POD 1 R Stent, no anticipated DC needs.

## 2014-03-03 NOTE — Progress Notes (Signed)
1 Day Post-Op Subjective: Patient reports feeling significantly better. Some moderate stent discomfort. Voiding okay. No recent chills. Currently afebrile with stable vital signs.  Objective: Vital signs in last 24 hours: Temp:  [97.6 F (36.4 C)-103.1 F (39.5 C)] 97.6 F (36.4 C) (08/19 0610) Pulse Rate:  [91-126] 91 (08/19 0610) Resp:  [15-20] 20 (08/19 0610) BP: (97-124)/(49-69) 99/56 mmHg (08/19 0610) SpO2:  [94 %-100 %] 98 % (08/19 0610) Weight:  [172 lb 11.2 oz (78.336 kg)] 172 lb 11.2 oz (78.336 kg) (08/18 1243)  Intake/Output from previous day: 08/18 0701 - 08/19 0700 In: 1852.4 [P.O.:240; I.V.:1301.7; IV Piggyback:310.8] Out: 200 [Urine:200] Intake/Output this shift: Total I/O In: 120 [P.O.:120] Out: 600 [Urine:600]  Physical Exam:  Constitutional: Vital signs reviewed. WD WN in NAD   Eyes: PERRL, No scleral icterus.   Cardiovascular: RRR Pulmonary/Chest: Normal effort Abdominal: Soft. Non-tender, non-distended, bowel sounds are normal, no masses, organomegaly, or guarding present.  Extremities: No cyanosis or edema   Lab Results:  Recent Labs  03/02/14 1321  HGB 15.0  HCT 44.0   BMET  Recent Labs  03/02/14 1321  NA 136*  K 4.4  CL 97  CO2 24  GLUCOSE 100*  BUN 15  CREATININE 0.93  CALCIUM 9.2   No results found for this basename: LABPT, INR,  in the last 72 hours No results found for this basename: LABURIN,  in the last 72 hours Results for orders placed during the hospital encounter of 03/02/14  MRSA PCR SCREENING     Status: None   Collection Time    03/02/14  1:31 PM      Result Value Ref Range Status   MRSA by PCR NEGATIVE  NEGATIVE Final   Comment:            The GeneXpert MRSA Assay (FDA     approved for NASAL specimens     only), is one component of a     comprehensive MRSA colonization     surveillance program. It is not     intended to diagnose MRSA     infection nor to guide or     monitor treatment for     MRSA infections.     Studies/Results: No results found.  Assessment/Plan:   Distal ureteral calculus with febrile urinary tract infection. Clinically improved. Given fever of 103 would keep her on IV antibiotics until culture and sensitivity available for conversion to oral antibiotics. We'll need definitive stone management in 10-14 days. Possible discharge tomorrow morning.   LOS: 1 day   Jayin Derousse S 03/03/2014, 12:28 PM

## 2014-03-03 NOTE — Progress Notes (Signed)
ANTIBIOTIC CONSULT NOTE - FOLLOW UP  Pharmacy Consult for Gentamicin  Indication: Pyelonephritis  No Known Allergies  Patient Measurements: Height: 5\' 2"  (157.5 cm) Weight: 172 lb 11.2 oz (78.336 kg) IBW/kg (Calculated) : 50.1 Adjusted Body Weight:   Vital Signs: Temp: 97.6 F (36.4 C) (08/19 0610) Temp src: Oral (08/19 0610) BP: 99/56 mmHg (08/19 0610) Pulse Rate: 91 (08/19 0610) Intake/Output from previous day: 08/18 0701 - 08/19 0700 In: 1852.4 [P.O.:240; I.V.:1301.7; IV Piggyback:310.8] Out: 200 [Urine:200] Intake/Output from this shift: Total I/O In: 740 [P.O.:240; I.V.:500] Out: -   Labs:  Recent Labs  03/02/14 1321  WBC 10.3  HGB 15.0  PLT 120*  CREATININE 0.93   Estimated Creatinine Clearance: 73.3 ml/min (by C-G formula based on Cr of 0.93).  Recent Labs  03/03/14 0103  GENTRANDOM 3.0     Microbiology: Recent Results (from the past 720 hour(s))  MRSA PCR SCREENING     Status: None   Collection Time    03/02/14  1:31 PM      Result Value Ref Range Status   MRSA by PCR NEGATIVE  NEGATIVE Final   Comment:            The GeneXpert MRSA Assay (FDA     approved for NASAL specimens     only), is one component of a     comprehensive MRSA colonization     surveillance program. It is not     intended to diagnose MRSA     infection nor to guide or     monitor treatment for     MRSA infections.    Anti-infectives   Start     Dose/Rate Route Frequency Ordered Stop   03/02/14 1500  gentamicin (GARAMYCIN) 430 mg in dextrose 5 % 100 mL IVPB     7 mg/kg  61.4 kg (Adjusted) 110.8 mL/hr over 60 Minutes Intravenous Every 24 hours 03/02/14 1403     03/02/14 1400  ciprofloxacin (CIPRO) IVPB 400 mg     400 mg 200 mL/hr over 60 Minutes Intravenous Every 12 hours 03/02/14 1236        Assessment: Patient with gentamicin level at goal for q24hr dosing.  Goal of Therapy:  [place] nomogram  Plan:  Follow up culture results Continue with current  dosing  Nani Skillern Crowford 03/03/2014,6:28 AM

## 2014-03-03 NOTE — Care Management Note (Signed)
CARE MANAGEMENT NOTE 03/03/2014  Patient:  Leslie Fitzgerald, Leslie Fitzgerald   Account Number:  [id number]  Date Initiated:  03/03/2014  Documentation initiated by:  Leafy Kindle  Subjective/Objective Assessment:   48 yo admitted r ureteral stone.     Action/Plan:   From home   Anticipated DC Date:  03/03/2014   Anticipated DC Plan:  [place]  CM consult      Choice offered to / List presented to:             Status of service:  In process, will continue to follow Medicare Important Message given?   (If response is "NO", the following Medicare IM given date fields will be blank) Date Medicare IM given:   Medicare IM given by:   Date Additional Medicare IM given:   Additional Medicare IM given by:    Discharge Disposition:    Per UR Regulation:  Reviewed for med. necessity/level of care/duration of stay  If discussed at [place] of Stay Meetings, dates discussed:    Comments:  03/03/14 Marney Doctor RN, NCM POD 1 R Stent, no anticipated DC needs.

## 2014-03-04 MED ORDER — CIPROFLOXACIN HCL 500 MG PO TABS: 500.0000 mg | ORAL_TABLET | Freq: Two times a day (BID) | ORAL | Status: DC

## 2014-03-04 MED ORDER — HYDROCODONE-ACETAMINOPHEN 5-325 MG PO TABS: 1.0000 | ORAL_TABLET | Freq: Four times a day (QID) | ORAL | Status: DC | PRN

## 2014-03-04 MED ORDER — URIBEL 118 MG PO CAPS: 1.0000 | ORAL_CAPSULE | Freq: Three times a day (TID) | ORAL | Status: DC | PRN

## 2014-03-04 NOTE — Discharge Instructions (Signed)

## 2014-03-04 NOTE — Progress Notes (Signed)
Discharge instructions explained, prescriptions given. Patient verbalizes understanding of same. Stable for discharge. 

## 2014-03-04 NOTE — Discharge Summary (Signed)
Patient ID: Leslie Fitzgerald MRN: [id number] DOB/AGE: 10/08/1965 48 y.o.  Admit date: 03/02/2014 Discharge date: 03/04/2014    Discharge Diagnoses:   Present on Admission:  . Ureteral stone with hydronephrosis  Consults:  None    Discharge Medications:   Medication List         acetaminophen 325 MG tablet  Commonly known as:  TYLENOL  Take 650 mg by mouth every 6 (six) hours as needed (pain).     aspirin 325 MG tablet  Take 162.5 mg by mouth once as needed for headache.     CALCIUM MAGNESIUM PO  Take 1 tablet by mouth 2 (two) times daily.     ciprofloxacin 500 MG tablet  Commonly known as:  CIPRO  Take 1 tablet (500 mg total) by mouth 2 (two) times daily.     HYDROcodone-acetaminophen 5-325 MG per tablet  Commonly known as:  NORCO/VICODIN  Take 1-2 tablets by mouth every 4 (four) hours as needed for moderate pain.     HYDROcodone-acetaminophen 5-325 MG per tablet  Commonly known as:  NORCO/VICODIN  Take 1-2 tablets by mouth every 6 (six) hours as needed.     ibuprofen 200 MG tablet  Commonly known as:  ADVIL,MOTRIN  Take 600 mg by mouth every 6 (six) hours as needed (pain.).     tamsulosin 0.4 MG Caps capsule  Commonly known as:  FLOMAX  Take 0.4 mg by mouth daily as needed (kidney stone).     URIBEL 118 MG Caps  Take 1 capsule (118 mg total) by mouth 3 (three) times daily as needed.         Significant Diagnostic Studies:  No results found.    Hospital Course:  Active Problems:   Ureteral stone with hydronephrosis  Leslie Fitzgerald has an extensive history of nephrolithiasis. She developed flank pain and fever and chills. She was diagnosed with a right distal ureteral stone and a fever to 103. For that reason she was emergently taken to the operating room for a cystoscopy and placement of a double-J stent by Dr. Tresa Moore. She clinically improved. She is now been afebrile. Her pain is minimal. She is to be discharged today with plans to definitively manage her stone  in 10-14 days. Urine culture office remains pending and she will be sent home on oral ciprofloxacin with adjustment as needed. Day of Discharge BP 109/68  Pulse 81  Temp(Src) 98.4 F (36.9 C) (Oral)  Resp 18  Ht 5\' 2"  (1.575 m)  Wt 172 lb 11.2 oz (78.336 kg)  BMI 31.58 kg/m2  SpO2 98%  LMP 08/30/2013 Well-developed well-nourished female in no acute distress. Respiratory effort normal. Abdomen soft. No significant CVA tenderness. Extremities without edema or tenderness No results found for this or any previous visit (from the past 24 hour(s)).

## 2014-03-05 ENCOUNTER — Other Ambulatory Visit: Payer: Self-pay | Admitting: Urology

## 2014-03-08 ENCOUNTER — Encounter (HOSPITAL_BASED_OUTPATIENT_CLINIC_OR_DEPARTMENT_OTHER): Payer: Self-pay | Admitting: *Deleted

## 2014-03-09 ENCOUNTER — Encounter (HOSPITAL_BASED_OUTPATIENT_CLINIC_OR_DEPARTMENT_OTHER): Payer: Self-pay | Admitting: *Deleted

## 2014-03-09 NOTE — Progress Notes (Addendum)
NPO AFTER MN. ARRIVE AT 0715. NEEDS URINE PREG. OTHER CURRENT LAB RESULTS IN CHART AND EPIC. MAY TAKE PAIN RX IF NEEDED AM DOS W/ SIPS OF WATER.

## 2014-03-12 ENCOUNTER — Other Ambulatory Visit: Payer: Self-pay | Admitting: Urology

## 2014-03-15 ENCOUNTER — Encounter (HOSPITAL_BASED_OUTPATIENT_CLINIC_OR_DEPARTMENT_OTHER): Payer: 59 | Admitting: Anesthesiology

## 2014-03-15 ENCOUNTER — Encounter (HOSPITAL_BASED_OUTPATIENT_CLINIC_OR_DEPARTMENT_OTHER)
Admission: RE | Disposition: A | Discharge status: Home / Self Care | Payer: Self-pay | Source: Ambulatory Visit | Attending: Urology

## 2014-03-15 ENCOUNTER — Encounter (HOSPITAL_BASED_OUTPATIENT_CLINIC_OR_DEPARTMENT_OTHER): Payer: Self-pay | Admitting: *Deleted

## 2014-03-15 ENCOUNTER — Ambulatory Visit (HOSPITAL_BASED_OUTPATIENT_CLINIC_OR_DEPARTMENT_OTHER): Payer: 59 | Admitting: Anesthesiology

## 2014-03-15 ENCOUNTER — Ambulatory Visit (HOSPITAL_BASED_OUTPATIENT_CLINIC_OR_DEPARTMENT_OTHER)
Admission: RE | Admit: 2014-03-15 | Discharge: 2014-03-15 | Disposition: A | Discharge status: Home / Self Care | Payer: 59 | Source: Ambulatory Visit | Attending: Urology | Admitting: Urology

## 2014-03-15 DIAGNOSIS — Z85828 Personal history of other malignant neoplasm of skin: Secondary | ICD-10-CM | POA: Insufficient documentation

## 2014-03-15 DIAGNOSIS — N201 Calculus of ureter: Secondary | ICD-10-CM | POA: Insufficient documentation

## 2014-03-15 HISTORY — DX: Personal history of colonic polyps: Z86.010

## 2014-03-15 HISTORY — DX: Calculus of kidney: N20.0

## 2014-03-15 HISTORY — PX: HOLMIUM LASER APPLICATION: SHX5852

## 2014-03-15 HISTORY — PX: URETEROSCOPY: SHX842

## 2014-03-15 HISTORY — DX: Tubulo-interstitial nephritis, not specified as acute or chronic: N12

## 2014-03-15 HISTORY — PX: CYSTOSCOPY W/ URETERAL STENT REMOVAL: SHX1430

## 2014-03-15 HISTORY — DX: Calculus of ureter: N20.1

## 2014-03-15 HISTORY — DX: Personal history of colon polyps, unspecified: Z86.0100

## 2014-03-15 HISTORY — DX: Personal history of other malignant neoplasm of skin: Z85.828

## 2014-03-15 LAB — POCT I-STAT, CHEM 8
BUN: 19 mg/dL (ref 6–23)
CALCIUM ION: 1.24 mmol/L — AB (ref 1.12–1.23)
CHLORIDE: 104 meq/L (ref 96–112)
Creatinine, Ser: 1 mg/dL (ref 0.50–1.10)
GLUCOSE: 93 mg/dL (ref 70–99)
HCT: 42 % (ref 36.0–46.0)
Hemoglobin: 14.3 g/dL (ref 12.0–15.0)
Potassium: 4 mEq/L (ref 3.7–5.3)
Sodium: 140 mEq/L (ref 137–147)
TCO2: 24 mmol/L (ref 0–100)

## 2014-03-15 SURGERY — REMOVAL, STENT, URETER, CYSTOSCOPIC
Anesthesia: General | Site: Ureter | Laterality: Right

## 2014-03-15 MED ORDER — ACETAMINOPHEN 10 MG/ML IV SOLN
INTRAVENOUS | Status: DC | PRN
  Administered 2014-03-15: 1000 mg via INTRAVENOUS

## 2014-03-15 MED ORDER — MIDAZOLAM HCL 5 MG/5ML IJ SOLN
INTRAMUSCULAR | Status: DC | PRN
  Administered 2014-03-15: 2 mg via INTRAVENOUS

## 2014-03-15 MED ORDER — KETOROLAC TROMETHAMINE 30 MG/ML IJ SOLN
INTRAMUSCULAR | Status: DC | PRN
  Administered 2014-03-15: 30 mg via INTRAVENOUS

## 2014-03-15 MED ORDER — PROPOFOL 10 MG/ML IV BOLUS
INTRAVENOUS | Status: DC | PRN
  Administered 2014-03-15: 150 mg via INTRAVENOUS

## 2014-03-15 MED ORDER — LACTATED RINGERS IV SOLN
INTRAVENOUS | Status: DC
  Administered 2014-03-15: 08:00:00 via INTRAVENOUS
  Filled 2014-03-15: qty 1000

## 2014-03-15 MED ORDER — NITROFURANTOIN MONOHYD MACRO 100 MG PO CAPS: 100.0000 mg | ORAL_CAPSULE | Freq: Two times a day (BID) | ORAL | Status: DC

## 2014-03-15 MED ORDER — LIDOCAINE HCL (CARDIAC) 20 MG/ML IV SOLN
INTRAVENOUS | Status: DC | PRN
  Administered 2014-03-15: 80 mg via INTRAVENOUS

## 2014-03-15 MED ORDER — KETOROLAC TROMETHAMINE 30 MG/ML IJ SOLN
15.0000 mg | Freq: Once | INTRAMUSCULAR | Status: DC | PRN
  Filled 2014-03-15: qty 1

## 2014-03-15 MED ORDER — BELLADONNA ALKALOIDS-OPIUM 16.2-60 MG RE SUPP
RECTAL | Status: AC
  Filled 2014-03-15: qty 1

## 2014-03-15 MED ORDER — SODIUM CHLORIDE 0.9 % IR SOLN
Status: DC | PRN
  Administered 2014-03-15: 4000 mL

## 2014-03-15 MED ORDER — LIDOCAINE HCL 2 % EX GEL
CUTANEOUS | Status: DC | PRN
  Administered 2014-03-15: 1 via URETHRAL

## 2014-03-15 MED ORDER — FENTANYL CITRATE 0.05 MG/ML IJ SOLN
INTRAMUSCULAR | Status: AC
  Filled 2014-03-15: qty 4

## 2014-03-15 MED ORDER — DEXAMETHASONE SODIUM PHOSPHATE 4 MG/ML IJ SOLN
INTRAMUSCULAR | Status: DC | PRN
  Administered 2014-03-15: 10 mg via INTRAVENOUS

## 2014-03-15 MED ORDER — MIDAZOLAM HCL 2 MG/2ML IJ SOLN
INTRAMUSCULAR | Status: AC
  Filled 2014-03-15: qty 2

## 2014-03-15 MED ORDER — PROMETHAZINE HCL 25 MG/ML IJ SOLN
6.2500 mg | INTRAMUSCULAR | Status: DC | PRN
  Filled 2014-03-15: qty 1

## 2014-03-15 MED ORDER — ONDANSETRON HCL 4 MG/2ML IJ SOLN
INTRAMUSCULAR | Status: DC | PRN
  Administered 2014-03-15: 4 mg via INTRAVENOUS

## 2014-03-15 MED ORDER — IOHEXOL 350 MG/ML SOLN: INTRAVENOUS | Status: DC | PRN

## 2014-03-15 MED ORDER — FENTANYL CITRATE 0.05 MG/ML IJ SOLN
INTRAMUSCULAR | Status: DC | PRN
  Administered 2014-03-15 (×2): 50 ug via INTRAVENOUS

## 2014-03-15 MED ORDER — FENTANYL CITRATE 0.05 MG/ML IJ SOLN
INTRAMUSCULAR | Status: AC
  Filled 2014-03-15: qty 2

## 2014-03-15 MED ORDER — METOCLOPRAMIDE HCL 5 MG/ML IJ SOLN
INTRAMUSCULAR | Status: DC | PRN
  Administered 2014-03-15: 10 mg via INTRAVENOUS

## 2014-03-15 MED ORDER — FENTANYL CITRATE 0.05 MG/ML IJ SOLN
25.0000 ug | INTRAMUSCULAR | Status: DC | PRN
  Administered 2014-03-15: 50 ug via INTRAVENOUS
  Filled 2014-03-15: qty 1

## 2014-03-15 MED ORDER — GENTAMICIN SULFATE 40 MG/ML IJ SOLN
5.0000 mg/kg | INTRAVENOUS | Status: AC
  Administered 2014-03-15: 390 mg via INTRAVENOUS
  Filled 2014-03-15: qty 9.75

## 2014-03-15 SURGICAL SUPPLY — 40 items
ADAPTER CATH URET PLST 4-6FR (CATHETERS) IMPLANT
BAG DRAIN URO-CYSTO SKYTR STRL (DRAIN) ×2 IMPLANT
BASKET LASER NITINOL 1.9FR (BASKET) IMPLANT
BASKET STNLS GEMINI 4WIRE 3FR (BASKET) IMPLANT
BASKET ZERO TIP NITINOL 2.4FR (BASKET) ×2 IMPLANT
BENZOIN TINCTURE PRP APPL 2/3 (GAUZE/BANDAGES/DRESSINGS) IMPLANT
CANISTER SUCT LVC 12 LTR MEDI- (MISCELLANEOUS) ×2 IMPLANT
CATH INTERMIT  6FR 70CM (CATHETERS) IMPLANT
CATH URET 5FR 28IN CONE TIP (BALLOONS)
CATH URET 5FR 28IN OPEN ENDED (CATHETERS) IMPLANT
CATH URET 5FR 70CM CONE TIP (BALLOONS) IMPLANT
CLOTH BEACON ORANGE TIMEOUT ST (SAFETY) ×2 IMPLANT
DRAPE CAMERA CLOSED 9X96 (DRAPES) ×2 IMPLANT
DRSG TEGADERM 2-3/8X2-3/4 SM (GAUZE/BANDAGES/DRESSINGS) IMPLANT
FIBER LASER FLEXIVA 200 (UROLOGICAL SUPPLIES) IMPLANT
FIBER LASER FLEXIVA 365 (UROLOGICAL SUPPLIES) ×2 IMPLANT
GLOVE BIO SURGEON STRL SZ 6.5 (GLOVE) ×2 IMPLANT
GLOVE BIO SURGEON STRL SZ7.5 (GLOVE) ×2 IMPLANT
GLOVE BIOGEL PI IND STRL 6.5 (GLOVE) ×1 IMPLANT
GLOVE BIOGEL PI INDICATOR 6.5 (GLOVE) ×1
GOWN PREVENTION PLUS LG XLONG (DISPOSABLE) IMPLANT
GOWN STRL REIN XL XLG (GOWN DISPOSABLE) ×2 IMPLANT
GOWN STRL REUS W/TWL LRG LVL3 (GOWN DISPOSABLE) ×2 IMPLANT
GOWN STRL REUS W/TWL XL LVL3 (GOWN DISPOSABLE) ×2 IMPLANT
GUIDEWIRE 0.038 PTFE COATED (WIRE) IMPLANT
GUIDEWIRE ANG ZIPWIRE 038X150 (WIRE) IMPLANT
GUIDEWIRE STR DUAL SENSOR (WIRE) IMPLANT
IV NS 1000ML (IV SOLUTION) ×1
IV NS 1000ML BAXH (IV SOLUTION) ×1 IMPLANT
IV NS IRRIG 3000ML ARTHROMATIC (IV SOLUTION) ×4 IMPLANT
KIT BALLIN UROMAX 15FX10 (LABEL) IMPLANT
KIT BALLN UROMAX 15FX4 (MISCELLANEOUS) IMPLANT
KIT BALLN UROMAX 26 75X4 (MISCELLANEOUS)
NS IRRIG 500ML POUR BTL (IV SOLUTION) IMPLANT
PACK CYSTOSCOPY (CUSTOM PROCEDURE TRAY) ×2 IMPLANT
SET HIGH PRES BAL DIL (LABEL)
SHEATH ACCESS URETERAL 38CM (SHEATH) IMPLANT
SHEATH ACCESS URETERAL 54CM (SHEATH) IMPLANT
SHEATH URET ACCESS 12FR/35CM (UROLOGICAL SUPPLIES) IMPLANT
SHEATH URET ACCESS 12FR/55CM (UROLOGICAL SUPPLIES) IMPLANT

## 2014-03-15 NOTE — H&P (View-Only) (Signed)
Leslie Fitzgerald is an 48 y.o. female.    Chief Complaint: Rt Ureteral Stone and Pyelonephritis  HPI:   1 - Recurrent Nephrolithiasis -  Pre 2015 - many episodes colic managed with medical therapy x many, SWL x several, URS x1 . Compositions mostly CaOx 09/2013 - CT scattered bilateral renal stones 02/2014 -   2 - Acute Rt Flank Pain, Fever, [name] - Pt with several days malaise and fever to 101.5 with right flank pain. No dysuria. Most recnet UCX 11/2013 enterococcus sens leqvaquin. Last meal 8:30AM today. CT today with 22mm Rt distal ureteral stone with hydro, SSD 12cm, 900 HU. No left hydro / left ureteral stone.  PMH sig for cesarean. No CV disease. No strong blood thinners. Her PCP is Leslie Fitzgerald in [place].  Today Leslie Fitzgerald is seen as urgent work in for above. She normally follows with Dr. Risa Grill.   Past Medical History  Diagnosis Date  . History of kidney stones   . Left ureteral calculus x7 stones  . Hydronephrosis, left   . Skin cancer     Past Surgical History  Procedure Laterality Date  . Right ureteroscopic stone extractions  03-28-2011  . Cysto/ left retrograde pyelogram/ left ureteroscopic laser litho/ stone extractions  03-03-2008  . D & c hysteroscopy/ novasure endometrial ablation  09-11-2006    REFACTORY MENORRHAGIA  . Hysteroscopy w/d&c  10-19-2002    AUB  . Cesarean section  02-12-2003    PRIMARY  . Cystoscopy w/ ureteral stent placement  02/18/2012    Procedure: CYSTOSCOPY WITH RETROGRADE PYELOGRAM/URETERAL STENT PLACEMENT;  Surgeon: Bernestine Amass, Fitzgerald;  Location: [facility];  Service: Urology;  Laterality: Left;  1 HR   . Ureteroscopy  02/18/2012    Procedure: URETEROSCOPY;  Surgeon: Bernestine Amass, Fitzgerald;  Location: [facility];  Service: Urology;  Laterality: Left;    Family History  Problem Relation Age of Onset  . Breast cancer Sister   . Skin cancer Sister   . Skin cancer Father   . Heart attack Father   . Colon  cancer Neg Hx   . Stomach cancer Neg Hx    Social History:  reports that she has never smoked. She has never used smokeless tobacco. She reports that she drinks alcohol. She reports that she does not use illicit drugs.  Allergies: No Known Allergies   (Not in a hospital admission)  No results found for this or any previous visit (from the past 48 hour(s)). No results found.  Review of Systems  Constitutional: Positive for fever, chills and malaise/fatigue.  HENT: Negative.   Eyes: Negative.   Cardiovascular: Negative.   Gastrointestinal: Negative.   Genitourinary: Positive for flank pain.  Musculoskeletal: Positive for myalgias.  Skin: Negative.   Neurological: Negative.   Endo/Heme/Allergies: Negative.   Psychiatric/Behavioral: Negative.     There were no vitals taken for this visit. Physical Exam  Constitutional: She appears well-developed and well-nourished.  HENT:  Head: Normocephalic and atraumatic.  Eyes: EOM are normal. Pupils are equal, round, and reactive to light.  Neck: Normal range of motion. Neck supple.  Cardiovascular:  tachycardia  Respiratory: Effort normal.  GI: Soft.  Genitourinary:  Mild Rt CVAT  Musculoskeletal: Normal range of motion.  Neurological: She is alert.  Skin: Skin is warm and dry.  Psychiatric: She has a normal mood and affect. Her behavior is normal. Judgment and thought content normal.     Assessment/Plan  1 - Recurrent Nephrolithiasis -  CT today with acute rt distal stone.  2 - Rt Flank Pain, Fever, Malaise - Discussed urgent priority is IV ABX and renal decompression with neph tube or stent followed by definitive stone treatment in elective setting after clears infection. Admit now, cysto rt retrograde and rt ureteral stent placement today. UCX obtained today in office.  Risks including bleeding, infection, damange to kidney / ureteter / bladder, non-cure, need for further surgery discussed.   Leslie Fitzgerald 03/02/2014, 11:51  AM

## 2014-03-15 NOTE — Anesthesia Postprocedure Evaluation (Signed)
  Anesthesia Post-op Note  Patient: Leslie Fitzgerald  Procedure(s) Performed: Procedure(s) (LRB): CYSTOSCOPY WITH STENT REMOVAL (Right) URETEROSCOPY (Right) HOLMIUM LASER APPLICATION (Right)  Patient Location: PACU  Anesthesia Type: General  Level of Consciousness: awake and alert   Airway and Oxygen Therapy: Patient Spontanous Breathing  Post-op Pain: mild  Post-op Assessment: Post-op Vital signs reviewed, Patient's Cardiovascular Status Stable, Respiratory Function Stable, Patent Airway and No signs of Nausea or vomiting  Last Vitals:  Filed Vitals:   03/15/14 0917  BP: 110/66  Pulse: 81  Temp: 36.6 C  Resp: 8    Post-op Vital Signs: stable   Complications: No apparent anesthesia complications

## 2014-03-15 NOTE — Interval H&P Note (Signed)
History and Physical Interval Note:  03/15/2014 8:29 AM  Leslie Fitzgerald  has presented today for surgery, with the diagnosis of Right Ureteral Calculus  The various methods of treatment have been discussed with the patient and family. After consideration of risks, benefits and other options for treatment, the patient has consented to  Procedure(s): CYSTOSCOPY WITH STENT REMOVAL (Right) URETEROSCOPY (Right) HOLMIUM LASER APPLICATION (Right) as a surgical intervention .  The patient's history has been reviewed, patient examined, no change in status, stable for surgery.  I have reviewed the patient's chart and labs.  Questions were answered to the patient's satisfaction.    She had placement of a double-J stent to manage her initial ureteral obstruction and concurrent infection. She now presents for definitive management of that stone. [name] S

## 2014-03-15 NOTE — Transfer of Care (Addendum)
Immediate Anesthesia Transfer of Care Note  Patient: Leslie Fitzgerald  Procedure(s) Performed: Procedure(s) (LRB): CYSTOSCOPY WITH STENT REMOVAL (Right) URETEROSCOPY (Right) HOLMIUM LASER APPLICATION (Right)  Patient Location: PACU  Anesthesia Type: General  Level of Consciousness: awake, alert  and oriented  Airway & Oxygen Therapy: Patient Spontanous Breathing and Patient connected to nasal cannula oxygen  Post-op Assessment: Report given to PACU RN and Post -op Vital signs reviewed and stable  Post vital signs: Reviewed and stable  Complications: No apparent anesthesia complications

## 2014-03-15 NOTE — Op Note (Signed)
Preoperative diagnosis: Right distal ureteral calculus Postoperative diagnosis: Same  Procedure: Cystoscopy, removal of right double-J stent, ureteroscopy, stone basketing   Surgeon: Bernestine Amass M.D.  Anesthesia: Gen.  Indications: These is 48 years of age with a long-standing history of nephrolithiasis. She presented approximately 10 days ago with right flank pain as well as fever and chills. She had multiple bilateral nonobstructing renal calculi and right sided hydronephrosis secondary to a 6 mm right ureteral stone. The patient underwent urgent placement of a double-J stent to decompress the right system. She was started on parental antibiotics. Urine culture from our office eventually showed multiple species. She was converted to oral antibiotics and discharged and has continued to do well clinically. She now presents for definitive management of her stone.     Technique and findings: Patient was brought the operating room where she underwent successful induction general anesthesia. He was placed in lithotomy position and prepped and draped usual manner. Appropriate surgical timeout was performed. Cystoscopy revealed a well-positioned right double-J stent. This was removed. Ureteroscopy was then performed with a 6 French rigid ureteroscope. 6 mm stone was encountered and basket extracted. The ureter was reinspected with the ureteroscope. Surprisingly a second 6-7 mm stone was encountered. I elected to fragment the stone with a holmium laser lithotripter fiber. 0.8 J x8 Hz. Stone was fractured into approximately half a dozen pieces which were then basket extracted. There was no need for ureteral dilation and no obvious injury to the ureter. For that reason we felt that the patient would do well without replacement of a double-J stent.   No obvious complications or problems occurred. She was brought to cover room in stable condition having had no obvious issues

## 2014-03-15 NOTE — Anesthesia Procedure Notes (Signed)
Procedure Name: LMA Insertion Date/Time: 03/15/2014 8:44 AM Performed by: Mechele Claude Pre-anesthesia Checklist: Patient identified, Emergency Drugs available, Suction available and Patient being monitored Patient Re-evaluated:Patient Re-evaluated prior to inductionOxygen Delivery Method: Circle System Utilized Preoxygenation: Pre-oxygenation with 100% oxygen Intubation Type: IV induction Ventilation: Mask ventilation without difficulty LMA: LMA inserted LMA Size: 4.0 Number of attempts: 1 Airway Equipment and Method: bite block Placement Confirmation: positive ETCO2 Tube secured with: Tape Dental Injury: Teeth and Oropharynx as per pre-operative assessment

## 2014-03-15 NOTE — Discharge Instructions (Signed)
[facility] [phone] Post Ureteroscopy With or Without Stent Instructions  Definitions:  Ureter: The duct that transports urine from the kidney to the bladder. Stent:   A plastic hollow tube that is placed into the ureter, from the kidney to the                 bladder to prevent the ureter from swelling shut.  GENERAL INSTRUCTIONS:  Despite the fact that no skin incisions were used, the area around the ureter and bladder is raw and irritated. The stent is a foreign body which will further irritate the bladder wall. This irritation is manifested by increased frequency of urination, both day and night, and by an increase in the urge to urinate. In some, the urge to urinate is present almost always. Sometimes the urge is strong enough that you may not be able to stop yourself from urinating. The only real cure is to remove the stent and then give time for the bladder wall to heal which can't be done until the danger of the ureter swelling shut has passed, which varies.  You may see some blood in your urine while the stent is in place and a few days afterwards. Do not be alarmed, even if the urine was clear for a while. Get off your feet and drink lots of fluids until clearing occurs. If you start to pass clots or don't improve, call us.  DIET: You may return to your normal diet immediately. Because of the raw surface of your bladder, alcohol, spicy foods, acid type foods and drinks with caffeine may cause irritation or frequency and should be used in moderation. To keep your urine flowing freely and to avoid constipation, drink plenty of fluids during the day ( 8-10 glasses ). Tip: Avoid cranberry juice because it is very acidic.  ACTIVITY: Your physical activity doesn't need to be restricted. However, if you are very active, you may see some blood in your urine. We suggest that you reduce your activity under these circumstances until the bleeding has stopped.  BOWELS: It is  important to keep your bowels regular during the postoperative period. Straining with bowel movements can cause bleeding. A bowel movement every other day is reasonable. Use a mild laxative if needed, such as Milk of Magnesia 2-3 tablespoons, or 2 Dulcolax tablets. Call if you continue to have problems. If you have been taking narcotics for pain, before, during or after your surgery, you may be constipated. Take a laxative if necessary.   MEDICATION: You should resume your pre-surgery medications unless told not to. In addition you will often be given an antibiotic to prevent infection. These should be taken as prescribed until the bottles are finished unless you are having an unusual reaction to one of the drugs.  PROBLEMS YOU SHOULD REPORT TO [place]:  Fevers over 100.5 Fahrenheit.  Heavy bleeding, or clots ( See above notes about blood in urine ).  Inability to urinate.  Drug reactions ( hives, rash, nausea, vomiting, diarrhea ).  Severe burning or pain with urination that is not improving.  FOLLOW-UP: You will need a follow-up appointment to monitor your progress. Call for this appointment at the number listed above. Usually the first appointment will be about three to fourteen days after your surgery.  You may restart aspirin when the hematuria resolves      Post Anesthesia Home Care Instructions  Activity: Get plenty of rest for the remainder of the day. A responsible adult should stay  with you for 24 hours following the procedure.  For the next 24 hours, DO NOT: -Drive a car -Paediatric nurse -Drink alcoholic beverages -Take any medication unless instructed by your physician -Make any legal decisions or sign important papers.  Meals: Start with liquid foods such as gelatin or soup. Progress to regular foods as tolerated. Avoid greasy, spicy, heavy foods. If nausea and/or vomiting occur, drink only clear liquids until the nausea and/or vomiting subsides. Call your physician  if vomiting continues.  Special Instructions/Symptoms: Your throat may feel dry or sore from the anesthesia or the breathing tube placed in your throat during surgery. If this causes discomfort, gargle with warm salt water. The discomfort should disappear within 24 hours.

## 2014-03-15 NOTE — Anesthesia Preprocedure Evaluation (Signed)
Anesthesia Evaluation  Patient identified by MRN, date of birth, ID band Patient awake    Reviewed: Allergy & Precautions, H&P , NPO status , Patient's Chart, lab work & pertinent test results  Airway Mallampati: II TM Distance: >3 FB Neck ROM: Full    Dental no notable dental hx.    Pulmonary neg pulmonary ROS,  breath sounds clear to auscultation  Pulmonary exam normal       Cardiovascular negative cardio ROS  Rhythm:Regular Rate:Normal     Neuro/Psych negative neurological ROS  negative psych ROS   GI/Hepatic negative GI ROS, Neg liver ROS,   Endo/Other  negative endocrine ROS  Renal/GU negative Renal ROS  negative genitourinary   Musculoskeletal negative musculoskeletal ROS (+)   Abdominal   Peds negative pediatric ROS (+)  Hematology negative hematology ROS (+)   Anesthesia Other Findings   Reproductive/Obstetrics negative OB ROS                           Anesthesia Physical Anesthesia Plan  ASA: I  Anesthesia Plan: General   Post-op Pain Management:    Induction: Intravenous  Airway Management Planned: LMA  Additional Equipment:   Intra-op Plan:   Post-operative Plan: Extubation in OR  Informed Consent: I have reviewed the patients History and Physical, chart, labs and discussed the procedure including the risks, benefits and alternatives for the proposed anesthesia with the patient or authorized representative who has indicated his/her understanding and acceptance.   Dental advisory given  Plan Discussed with: CRNA and Surgeon  Anesthesia Plan Comments:         Anesthesia Quick Evaluation  

## 2014-03-16 ENCOUNTER — Encounter (HOSPITAL_BASED_OUTPATIENT_CLINIC_OR_DEPARTMENT_OTHER): Payer: Self-pay | Admitting: Urology

## 2018-03-23 ENCOUNTER — Other Ambulatory Visit: Payer: Self-pay

## 2018-03-23 ENCOUNTER — Observation Stay (HOSPITAL_COMMUNITY)
Admission: EM | Admit: 2018-03-23 | Discharge: 2018-03-25 | Disposition: A | Discharge status: Home / Self Care | Payer: BLUE CROSS/BLUE SHIELD | Attending: Family Medicine | Admitting: Family Medicine

## 2018-03-23 ENCOUNTER — Encounter (HOSPITAL_COMMUNITY): Payer: Self-pay

## 2018-03-23 ENCOUNTER — Emergency Department (HOSPITAL_COMMUNITY): Payer: BLUE CROSS/BLUE SHIELD

## 2018-03-23 DIAGNOSIS — N136 Pyonephrosis: Secondary | ICD-10-CM | POA: Diagnosis not present

## 2018-03-23 DIAGNOSIS — R000 Tachycardia, unspecified: Secondary | ICD-10-CM | POA: Diagnosis not present

## 2018-03-23 DIAGNOSIS — N390 Urinary tract infection, site not specified: Secondary | ICD-10-CM | POA: Diagnosis present

## 2018-03-23 DIAGNOSIS — N132 Hydronephrosis with renal and ureteral calculous obstruction: Secondary | ICD-10-CM | POA: Diagnosis present

## 2018-03-23 DIAGNOSIS — R109 Unspecified abdominal pain: Secondary | ICD-10-CM | POA: Diagnosis present

## 2018-03-23 DIAGNOSIS — N201 Calculus of ureter: Secondary | ICD-10-CM

## 2018-03-23 DIAGNOSIS — Z85828 Personal history of other malignant neoplasm of skin: Secondary | ICD-10-CM | POA: Insufficient documentation

## 2018-03-23 DIAGNOSIS — R8281 Pyuria: Secondary | ICD-10-CM

## 2018-03-23 LAB — CBC WITH DIFFERENTIAL/PLATELET
Basophils Absolute: 0 10*3/uL (ref 0.0–0.1)
Basophils Relative: 0 %
Eosinophils Absolute: 0.1 10*3/uL (ref 0.0–0.7)
Eosinophils Relative: 2 %
HCT: 42.2 % (ref 36.0–46.0)
Hemoglobin: 14.2 g/dL (ref 12.0–15.0)
LYMPHS PCT: 28 %
Lymphs Abs: 1.5 10*3/uL (ref 0.7–4.0)
MCH: 30.7 pg (ref 26.0–34.0)
MCHC: 33.6 g/dL (ref 30.0–36.0)
MCV: 91.1 fL (ref 78.0–100.0)
MONO ABS: 0.6 10*3/uL (ref 0.1–1.0)
Monocytes Relative: 11 %
Neutro Abs: 3.1 10*3/uL (ref 1.7–7.7)
Neutrophils Relative %: 59 %
Platelets: 163 10*3/uL (ref 150–400)
RBC: 4.63 MIL/uL (ref 3.87–5.11)
RDW: 12.8 % (ref 11.5–15.5)
WBC: 5.2 10*3/uL (ref 4.0–10.5)

## 2018-03-23 LAB — I-STAT BETA HCG BLOOD, ED (MC, WL, AP ONLY): I-stat hCG, quantitative: <5 m[IU]/mL (ref <5)

## 2018-03-23 LAB — COMPREHENSIVE METABOLIC PANEL
ALT: 14 U/L (ref 0–44)
ANION GAP: 12 (ref 5–15)
AST: 20 U/L (ref 15–41)
Albumin: 3.8 g/dL (ref 3.5–5.0)
Alkaline Phosphatase: 77 U/L (ref 38–126)
BUN: 20 mg/dL (ref 6–20)
CALCIUM: 9.5 mg/dL (ref 8.9–10.3)
CO2: 26 mmol/L (ref 22–32)
Chloride: 105 mmol/L (ref 98–111)
Creatinine, Ser: 1.26 mg/dL — ABNORMAL HIGH (ref 0.44–1.00)
GFR calc Af Amer: 57 mL/min — ABNORMAL LOW (ref >60)
GFR, EST NON AFRICAN AMERICAN: 49 mL/min — AB (ref >60)
Glucose, Bld: 101 mg/dL — ABNORMAL HIGH (ref 70–99)
POTASSIUM: 3.7 mmol/L (ref 3.5–5.1)
Sodium: 143 mmol/L (ref 135–145)
Total Bilirubin: 0.7 mg/dL (ref 0.3–1.2)
Total Protein: 7.3 g/dL (ref 6.5–8.1)

## 2018-03-23 LAB — URINALYSIS, ROUTINE W REFLEX MICROSCOPIC
Bilirubin Urine: NEGATIVE
Glucose, UA: NEGATIVE mg/dL
Ketones, ur: NEGATIVE mg/dL
Nitrite: NEGATIVE
Protein, ur: NEGATIVE mg/dL
Specific Gravity, Urine: 1.013 (ref 1.005–1.030)
pH: 8 (ref 5.0–8.0)

## 2018-03-23 LAB — I-STAT CG4 LACTIC ACID, ED: Lactic Acid, Venous: 1.63 mmol/L (ref 0.5–1.9)

## 2018-03-23 MED ORDER — SODIUM CHLORIDE 0.9 % IV BOLUS
2000.0000 mL | Freq: Once | INTRAVENOUS | Status: AC
  Administered 2018-03-23: 2000 mL via INTRAVENOUS

## 2018-03-23 MED ORDER — KETOROLAC TROMETHAMINE 30 MG/ML IJ SOLN
30.0000 mg | Freq: Once | INTRAMUSCULAR | Status: AC
  Administered 2018-03-23: 30 mg via INTRAVENOUS
  Filled 2018-03-23: qty 1

## 2018-03-23 MED ORDER — SODIUM CHLORIDE 0.9 % IV SOLN
2.0000 g | Freq: Once | INTRAVENOUS | Status: AC
  Administered 2018-03-23: 2 g via INTRAVENOUS
  Filled 2018-03-23: qty 20

## 2018-03-23 MED ORDER — ONDANSETRON HCL 4 MG/2ML IJ SOLN
4.0000 mg | Freq: Once | INTRAMUSCULAR | Status: AC
  Administered 2018-03-23: 4 mg via INTRAVENOUS
  Filled 2018-03-23: qty 2

## 2018-03-23 MED ORDER — HYDROMORPHONE HCL 1 MG/ML IJ SOLN
1.0000 mg | Freq: Once | INTRAMUSCULAR | Status: AC
  Administered 2018-03-23: 1 mg via INTRAVENOUS
  Filled 2018-03-23: qty 1

## 2018-03-23 NOTE — ED Triage Notes (Signed)
Pt reports hx of kidney stones and feels pain in her L flank that started about 3 days ago. Patient also reports fever, chills, and generalized body aches and thinks it may be infected. [facility] and they recommended that she come in.

## 2018-03-23 NOTE — ED Provider Notes (Signed)
[facility] DEPT Provider Note   CSN: [id number] Arrival date & time: 03/23/18  2123     History   Chief Complaint Chief Complaint  Patient presents with  . Flank Pain    L  . Fever    HPI Leslie Fitzgerald is a 52 y.o. female.  52 year old female with a history of kidney stones presents to the emergency department for evaluation of left flank pain.  Reports an intermittent aching comfort in her left flank which is nonradiating.  This has progressed to include a soreness across her low back.  Feels that her symptoms are consistent with prior ureterolithiasis.  Notes onset this past Wednesday.  Has been taking ibuprofen for pain, but began to notice subjective fever as well as chills and generalized body aches yesterday.  Is concerned that she may have an infected stone.  Maximum temperature prior to arrival 100.2 F.  She denies any specific dysuria, but has had some increased frequency.  No hematuria noted.  Complains of nausea without emesis.  Is followed by [facility].     Past Medical History:  Diagnosis Date  . History of colon polyps   . History of kidney stones   . History of nonmelanoma skin cancer   . Nephrolithiasis    BILATERAL  . PONV (postoperative nausea and vomiting)   . Pyelonephritis   . Right ureteral stone     Patient Active Problem List   Diagnosis Date Noted  . UTI (urinary tract infection) 03/24/2018  . Ureteral stone with hydronephrosis 03/02/2014  . Ureteral calculi 02/18/2012    Past Surgical History:  Procedure Laterality Date  . CESAREAN SECTION  02-12-2003   PRIMARY  . COLONOSCOPY W/ POLYPECTOMY  03/ 2015  . CYSTO/ LEFT RETROGRADE PYELOGRAM/ LEFT URETEROSCOPIC LASER LITHO/ STONE EXTRACTIONS  03-03-2008  . CYSTOSCOPY W/ URETERAL STENT PLACEMENT  02/18/2012   Procedure: CYSTOSCOPY WITH RETROGRADE PYELOGRAM/URETERAL STENT PLACEMENT;  Surgeon: Bernestine Amass, MD;  Location: [facility];  Service:  Urology;  Laterality: Left;  1 HR   . CYSTOSCOPY W/ URETERAL STENT REMOVAL Right 03/15/2014   Procedure: CYSTOSCOPY WITH STENT REMOVAL;  Surgeon: Bernestine Amass, MD;  Location: [facility];  Service: Urology;  Laterality: Right;  . CYSTOSCOPY WITH URETEROSCOPY AND STENT PLACEMENT Right 03/02/2014   Procedure: CYSTOSCOPY WITH RIGHT RETROGRADE AND STENT PLACEMENT;  Surgeon: Alexis Frock, MD;  Location: [facility];  Service: Urology;  Laterality: Right;  . D & C HYSTEROSCOPY/ NOVASURE ENDOMETRIAL ABLATION  09-11-2006   REFACTORY MENORRHAGIA  . HOLMIUM LASER APPLICATION Right 1/60/7371   Procedure: HOLMIUM LASER APPLICATION;  Surgeon: Bernestine Amass, MD;  Location: [facility];  Service: Urology;  Laterality: Right;  . HYSTEROSCOPY W/D&C  10-19-2002   AUB  . RIGHT URETEROSCOPIC STONE EXTRACTIONS  03-28-2011  . URETEROSCOPY  02/18/2012   Procedure: URETEROSCOPY;  Surgeon: Bernestine Amass, MD;  Location: [facility];  Service: Urology;  Laterality: Left;  . URETEROSCOPY Right 03/15/2014   Procedure: URETEROSCOPY;  Surgeon: Bernestine Amass, MD;  Location: [facility];  Service: Urology;  Laterality: Right;     OB History   None      Home Medications    Prior to Admission medications   Medication Sig Start Date End Date Taking? Authorizing Provider  clonazePAM (KLONOPIN) 1 MG tablet Take 1 mg by mouth at bedtime. 03/12/18  Yes [provider]  ibuprofen (ADVIL,MOTRIN) 200 MG tablet  Take 600 mg by mouth every 6 (six) hours as needed (pain.).   Yes [provider]  MAGNESIUM PO Take 1 tablet by mouth 2 (two) times daily.   Yes [provider]    Family History Family History  Problem Relation Age of Onset  . Breast cancer Sister   . Skin cancer Sister   . Skin cancer Father   . Heart attack Father   . Colon cancer Neg Hx   . Stomach cancer Neg Hx     Social History Social History   Tobacco Use  .  Smoking status: Never Smoker  . Smokeless tobacco: Never Used  Substance Use Topics  . Alcohol use: Yes    Comment: occasional  . Drug use: No     Allergies   Patient has no known allergies.   Review of Systems Review of Systems Ten systems reviewed and are negative for acute change, except as noted in the HPI.    Physical Exam Updated Vital Signs BP 106/65 (BP Location: Right Arm)   Pulse 90   Temp 98.9 F (37.2 C) (Oral)   Resp 17   Ht 5\' 2"  (1.575 m)   Wt 88.7 kg   SpO2 96%   BMI 35.77 kg/m   Physical Exam  Constitutional: She is oriented to person, place, and time. She appears well-developed and well-nourished. No distress.  Nontoxic appearing and in NAD  HENT:  Head: Normocephalic and atraumatic.  Eyes: Conjunctivae and EOM are normal. No scleral icterus.  Neck: Normal range of motion.  Cardiovascular: Normal rate, regular rhythm and intact distal pulses.  Intermittent tachycardia  Pulmonary/Chest: Effort normal. No stridor. No respiratory distress. She has no wheezes. She has no rales.  Lungs CTAB  Abdominal: Soft. She exhibits no mass. There is no guarding.  Soft abdomen without focal tenderness or peritoneal signs.  Musculoskeletal: Normal range of motion.  Neurological: She is alert and oriented to person, place, and time. She exhibits normal muscle tone. Coordination normal.  Skin: Skin is warm and dry. No rash noted. She is not diaphoretic. No erythema. No pallor.  Psychiatric: She has a normal mood and affect. Her behavior is normal.  Nursing note and vitals reviewed.    ED Treatments / Results  Labs (all labs ordered are listed, but only abnormal results are displayed) Labs Reviewed  COMPREHENSIVE METABOLIC PANEL - Abnormal; Notable for the following components:      Result Value   Glucose, Bld 101 (*)    Creatinine, Ser 1.26 (*)    GFR calc non Af Amer 49 (*)    GFR calc Af Amer 57 (*)    All other components within normal limits    URINALYSIS, ROUTINE W REFLEX MICROSCOPIC - Abnormal; Notable for the following components:   APPearance HAZY (*)    Hgb urine dipstick SMALL (*)    Leukocytes, UA MODERATE (*)    Bacteria, UA RARE (*)    All other components within normal limits  URINE CULTURE  CBC WITH DIFFERENTIAL/PLATELET  HIV ANTIBODY (ROUTINE TESTING)  CBC  BASIC METABOLIC PANEL  I-STAT CG4 LACTIC ACID, ED  I-STAT BETA HCG BLOOD, ED ([facility], [facility], [facility] ONLY)    EKG None  Radiology Ct Renal Stone Study  Result Date: 03/24/2018 CLINICAL DATA:  Left flank pain.  History of kidney stones. EXAM: CT ABDOMEN AND PELVIS WITHOUT CONTRAST TECHNIQUE: Multidetector CT imaging of the abdomen and pelvis was performed following the standard protocol without IV contrast. COMPARISON:  Most recent CT 03/02/2014, additional priors including 03/24/2012 FINDINGS: Lower chest: Hypoventilatory atelectasis in the lung bases. 4 mm subpleural nodule in the left lower lobe, unchanged from 03/24/2012 CT and considered benign. Hepatobiliary: 19 mm cyst in the right lobe of the liver. Additional scattered hepatic hypodensities are incompletely characterized in the absence of IV contrast but likely cysts or hemangiomas. Gallbladder physiologically distended, no calcified stone. No biliary dilatation. Pancreas: No ductal dilatation or inflammation. Spleen: Normal in size without focal abnormality. Adrenals/Urinary Tract: Normal adrenal glands. Obstructing 5 x 6 mm stone in the left mid ureter with resultant moderate hydroureteronephrosis and mild perinephric edema. Multiple additional nonobstructing stones within the left kidney. Chronic thinning of left renal parenchyma. Multiple nonobstructing stones in the right kidney. No right hydronephrosis or hydroureter. No right ureteral calculi. Urinary bladder is physiologically distended without wall thickening or bladder stone. Stomach/Bowel: Stomach physiologically distended. No bowel wall thickening, inflammatory  change or obstruction. Mild distal colonic diverticulosis without diverticulitis. Appendix not confidently visualized. Vascular/Lymphatic: Abdominal aorta is normal in caliber. Small retroperitoneal nodes likely reactive. No enlarged lymph nodes in the abdomen or pelvis. Reproductive: Uterus and bilateral adnexa are unremarkable. Other: No free air, free fluid, or intra-abdominal fluid collection. Small fat containing umbilical hernia. Musculoskeletal: There are no acute or suspicious osseous abnormalities. IMPRESSION: 1. Obstructing 6 mm stone in the left mid ureter with resultant hydronephrosis. 2. Multiple nonobstructing stones in both kidneys. Chronic left renal parenchymal thinning. 3. Distal colonic diverticulosis without diverticulitis. Electronically Signed   By: Keith Rake M.D.   On: 03/24/2018 00:05    Procedures Procedures (including critical care time)  Medications Ordered in ED Medications  ketorolac (TORADOL) 30 MG/ML injection 30 mg (has no administration in time range)  clonazePAM (KLONOPIN) tablet 1 mg (has no administration in time range)  acetaminophen (TYLENOL) tablet 650 mg (has no administration in time range)    Or  acetaminophen (TYLENOL) suppository 650 mg (has no administration in time range)  ondansetron (ZOFRAN) tablet 4 mg (has no administration in time range)    Or  ondansetron (ZOFRAN) injection 4 mg (has no administration in time range)  cefTRIAXone (ROCEPHIN) 1 g in sodium chloride 0.9 % 100 mL IVPB (has no administration in time range)  HYDROmorphone (DILAUDID) injection 1 mg (has no administration in time range)  sodium chloride 0.9 % bolus 2,000 mL (0 mLs Intravenous Stopped 03/24/18 0100)  HYDROmorphone (DILAUDID) injection 1 mg (1 mg Intravenous Given 03/23/18 2232)  ondansetron (ZOFRAN) injection 4 mg (4 mg Intravenous Given 03/23/18 2233)  cefTRIAXone (ROCEPHIN) 2 g in sodium chloride 0.9 % 100 mL IVPB (0 g Intravenous Stopped 03/24/18 0028)  ketorolac  (TORADOL) 30 MG/ML injection 30 mg (30 mg Intravenous Given 03/23/18 2341)  acetaminophen (TYLENOL) tablet 1,000 mg (1,000 mg Oral Given 03/24/18 0045)  sodium chloride 0.9 % bolus 500 mL (0 mLs Intravenous Stopped 03/24/18 0226)     Initial Impression / Assessment and Plan / ED Course  I have reviewed the triage vital signs and the nursing notes.  Pertinent labs & imaging results that were available during my care of the patient were reviewed by me and considered in my medical decision making (see chart for details).     11:45 PM Dr. Tresa Moore of [facility] was in the emergency department for procedural reasons.  Was in communication with the patient prior to her arrival.  In passing with Dr. Tresa Moore, I did discuss obstructing ureterolithiasis of 30mm as well as reassuring WBC and  lactate.  Kidney function is largely preserved.  Based on work up, Dr. Tresa Moore feels the patient is appropriate for outpatient office follow up.  12:35 AM Patient informed of formal CT read as well as pyuria suspicious for infection.  She is noted to be tachycardic between 111-115bpm.  Will add 500cc IVF for hydration at 30cc/kg.  Already given 2g IV Rocephin.  Recheck temp is 99.7 F.  Tylenol ordered.  Plan for further observation in the ED given borderline criteria for SIRS/Sepsis.  2:00 AM The patient has been fully hydrated with 2.5L IVF.  Tylenol previously given.  Pain well controlled.  She continues to have mild, persistent tachycardia.  Though patient is presently stable, there is clinical concern that her status may decline in the setting of ureteral obstruction and urinary tract infection.  I believe the patient would benefit from continued inpatient IV abx and observation for this reason.  I do not feel formal consult to Urology for urgent/emergent intervention is presently indicated.  Case discussed with Dr. Alcario Drought; [facility] to admit.   Final Clinical Impressions(s) / ED Diagnoses   Final diagnoses:    Ureterolithiasis  Pyuria  Tachycardia    ED Discharge Orders    None       Antonietta Breach, PA-C 03/24/18 0254    Valarie Merino, MD 03/27/18 2130

## 2018-03-23 NOTE — ED Notes (Signed)
Patient transported to CT 

## 2018-03-24 ENCOUNTER — Observation Stay (HOSPITAL_COMMUNITY): Payer: BLUE CROSS/BLUE SHIELD | Admitting: Anesthesiology

## 2018-03-24 ENCOUNTER — Encounter (HOSPITAL_COMMUNITY): Payer: Self-pay | Admitting: Urology

## 2018-03-24 ENCOUNTER — Observation Stay (HOSPITAL_COMMUNITY): Payer: BLUE CROSS/BLUE SHIELD

## 2018-03-24 ENCOUNTER — Encounter (HOSPITAL_COMMUNITY)
Admission: EM | Disposition: A | Discharge status: Home / Self Care | Payer: Self-pay | Source: Home / Self Care | Attending: Emergency Medicine

## 2018-03-24 DIAGNOSIS — N132 Hydronephrosis with renal and ureteral calculous obstruction: Secondary | ICD-10-CM

## 2018-03-24 DIAGNOSIS — R319 Hematuria, unspecified: Secondary | ICD-10-CM | POA: Diagnosis not present

## 2018-03-24 DIAGNOSIS — N390 Urinary tract infection, site not specified: Secondary | ICD-10-CM | POA: Diagnosis present

## 2018-03-24 HISTORY — PX: CYSTOSCOPY/URETEROSCOPY/HOLMIUM LASER/STENT PLACEMENT: SHX6546

## 2018-03-24 LAB — CBC
HCT: 37.9 % (ref 36.0–46.0)
Hemoglobin: 12.4 g/dL (ref 12.0–15.0)
MCH: 30 pg (ref 26.0–34.0)
MCHC: 32.7 g/dL (ref 30.0–36.0)
MCV: 91.5 fL (ref 78.0–100.0)
Platelets: 137 10*3/uL — ABNORMAL LOW (ref 150–400)
RBC: 4.14 MIL/uL (ref 3.87–5.11)
RDW: 13 % (ref 11.5–15.5)
WBC: 5.4 10*3/uL (ref 4.0–10.5)

## 2018-03-24 LAB — BASIC METABOLIC PANEL
Anion gap: 5 (ref 5–15)
BUN: 15 mg/dL (ref 6–20)
CHLORIDE: 110 mmol/L (ref 98–111)
CO2: 28 mmol/L (ref 22–32)
Calcium: 8.3 mg/dL — ABNORMAL LOW (ref 8.9–10.3)
Creatinine, Ser: 1.02 mg/dL — ABNORMAL HIGH (ref 0.44–1.00)
GFR calc Af Amer: >60 mL/min (ref >60)
Glucose, Bld: 113 mg/dL — ABNORMAL HIGH (ref 70–99)
POTASSIUM: 4.1 mmol/L (ref 3.5–5.1)
SODIUM: 143 mmol/L (ref 135–145)

## 2018-03-24 LAB — HIV ANTIBODY (ROUTINE TESTING W REFLEX): HIV Screen 4th Generation wRfx: NONREACTIVE

## 2018-03-24 LAB — MRSA PCR SCREENING: MRSA by PCR: NEGATIVE

## 2018-03-24 SURGERY — CYSTOSCOPY/URETEROSCOPY/HOLMIUM LASER/STENT PLACEMENT
Anesthesia: General | Site: Ureter | Laterality: Left

## 2018-03-24 MED ORDER — PROPOFOL 10 MG/ML IV BOLUS
INTRAVENOUS | Status: DC | PRN
  Administered 2018-03-24: 200 mg via INTRAVENOUS

## 2018-03-24 MED ORDER — SODIUM CHLORIDE 0.9 % IV SOLN
INTRAVENOUS | Status: AC
  Filled 2018-03-24: qty 20

## 2018-03-24 MED ORDER — ACETAMINOPHEN 500 MG PO TABS
1000.0000 mg | ORAL_TABLET | Freq: Once | ORAL | Status: AC
  Administered 2018-03-24: 1000 mg via ORAL
  Filled 2018-03-24: qty 2

## 2018-03-24 MED ORDER — SODIUM CHLORIDE 0.9 % IV BOLUS
500.0000 mL | Freq: Once | INTRAVENOUS | Status: AC
  Administered 2018-03-24: 500 mL via INTRAVENOUS

## 2018-03-24 MED ORDER — DEXTROSE 5 % IV SOLN
INTRAVENOUS | Status: DC | PRN
  Administered 2018-03-24: 2 g via INTRAVENOUS

## 2018-03-24 MED ORDER — LACTATED RINGERS IV SOLN: INTRAVENOUS | Status: DC

## 2018-03-24 MED ORDER — FENTANYL CITRATE (PF) 100 MCG/2ML IJ SOLN
INTRAMUSCULAR | Status: AC
  Filled 2018-03-24: qty 2

## 2018-03-24 MED ORDER — MIDAZOLAM HCL 2 MG/2ML IJ SOLN
INTRAMUSCULAR | Status: AC
  Filled 2018-03-24: qty 2

## 2018-03-24 MED ORDER — ONDANSETRON HCL 4 MG/2ML IJ SOLN: 4.0000 mg | Freq: Four times a day (QID) | INTRAMUSCULAR | Status: DC | PRN

## 2018-03-24 MED ORDER — ACETAMINOPHEN 650 MG RE SUPP: 650.0000 mg | Freq: Four times a day (QID) | RECTAL | Status: DC | PRN

## 2018-03-24 MED ORDER — ONDANSETRON HCL 4 MG/2ML IJ SOLN
INTRAMUSCULAR | Status: DC | PRN
  Administered 2018-03-24: 4 mg via INTRAVENOUS

## 2018-03-24 MED ORDER — HYDROMORPHONE HCL 1 MG/ML IJ SOLN
1.0000 mg | INTRAMUSCULAR | Status: DC | PRN
  Administered 2018-03-24 – 2018-03-25 (×3): 1 mg via INTRAVENOUS
  Filled 2018-03-24 (×3): qty 1

## 2018-03-24 MED ORDER — LACTATED RINGERS IV SOLN
INTRAVENOUS | Status: DC | PRN
  Administered 2018-03-24: 17:00:00 via INTRAVENOUS

## 2018-03-24 MED ORDER — FENTANYL CITRATE (PF) 100 MCG/2ML IJ SOLN
INTRAMUSCULAR | Status: DC | PRN
  Administered 2018-03-24: 50 ug via INTRAVENOUS

## 2018-03-24 MED ORDER — PHENYLEPHRINE 40 MCG/ML (10ML) SYRINGE FOR IV PUSH (FOR BLOOD PRESSURE SUPPORT)
PREFILLED_SYRINGE | INTRAVENOUS | Status: DC | PRN
  Administered 2018-03-24: 80 ug via INTRAVENOUS

## 2018-03-24 MED ORDER — PROPOFOL 10 MG/ML IV BOLUS
INTRAVENOUS | Status: AC
  Filled 2018-03-24: qty 20

## 2018-03-24 MED ORDER — KETOROLAC TROMETHAMINE 30 MG/ML IJ SOLN
30.0000 mg | Freq: Four times a day (QID) | INTRAMUSCULAR | Status: DC | PRN
  Administered 2018-03-24 – 2018-03-25 (×2): 30 mg via INTRAVENOUS
  Filled 2018-03-24 (×3): qty 1

## 2018-03-24 MED ORDER — DEXAMETHASONE SODIUM PHOSPHATE 10 MG/ML IJ SOLN
INTRAMUSCULAR | Status: AC
  Filled 2018-03-24: qty 1

## 2018-03-24 MED ORDER — ACETAMINOPHEN 325 MG PO TABS
650.0000 mg | ORAL_TABLET | Freq: Four times a day (QID) | ORAL | Status: DC | PRN
  Filled 2018-03-24: qty 2

## 2018-03-24 MED ORDER — SODIUM CHLORIDE 0.9 % IV SOLN
INTRAVENOUS | Status: DC | PRN
  Administered 2018-03-24: 250 mL via INTRAVENOUS

## 2018-03-24 MED ORDER — PHENYLEPHRINE 40 MCG/ML (10ML) SYRINGE FOR IV PUSH (FOR BLOOD PRESSURE SUPPORT)
PREFILLED_SYRINGE | INTRAVENOUS | Status: AC
  Filled 2018-03-24: qty 10

## 2018-03-24 MED ORDER — MIDAZOLAM HCL 5 MG/5ML IJ SOLN
INTRAMUSCULAR | Status: DC | PRN
  Administered 2018-03-24: 2 mg via INTRAVENOUS

## 2018-03-24 MED ORDER — PROMETHAZINE HCL 25 MG/ML IJ SOLN: 6.2500 mg | INTRAMUSCULAR | Status: DC | PRN

## 2018-03-24 MED ORDER — SODIUM CHLORIDE 0.9 % IV SOLN
1.0000 g | INTRAVENOUS | Status: DC
  Administered 2018-03-24: 1 g via INTRAVENOUS
  Filled 2018-03-24: qty 1

## 2018-03-24 MED ORDER — DEXAMETHASONE SODIUM PHOSPHATE 10 MG/ML IJ SOLN
INTRAMUSCULAR | Status: DC | PRN
  Administered 2018-03-24: 10 mg via INTRAVENOUS

## 2018-03-24 MED ORDER — MEPERIDINE HCL 50 MG/ML IJ SOLN: 6.2500 mg | INTRAMUSCULAR | Status: DC | PRN

## 2018-03-24 MED ORDER — LACTATED RINGERS IV SOLN
INTRAVENOUS | Status: DC
  Administered 2018-03-24 – 2018-03-25 (×2): via INTRAVENOUS

## 2018-03-24 MED ORDER — ONDANSETRON HCL 4 MG/2ML IJ SOLN
INTRAMUSCULAR | Status: AC
  Filled 2018-03-24: qty 2

## 2018-03-24 MED ORDER — SODIUM CHLORIDE 0.9 % IR SOLN
Status: DC | PRN
  Administered 2018-03-24: 3000 mL

## 2018-03-24 MED ORDER — LIDOCAINE 2% (20 MG/ML) 5 ML SYRINGE
INTRAMUSCULAR | Status: DC | PRN
  Administered 2018-03-24: 80 mg via INTRAVENOUS

## 2018-03-24 MED ORDER — IOHEXOL 300 MG/ML  SOLN
INTRAMUSCULAR | Status: DC | PRN
  Administered 2018-03-24: 4 mL via INTRAVENOUS

## 2018-03-24 MED ORDER — HYDROMORPHONE HCL 1 MG/ML IJ SOLN: 0.5000 mg | INTRAMUSCULAR | Status: DC | PRN

## 2018-03-24 MED ORDER — FENTANYL CITRATE (PF) 100 MCG/2ML IJ SOLN: 25.0000 ug | INTRAMUSCULAR | Status: DC | PRN

## 2018-03-24 MED ORDER — CLONAZEPAM 1 MG PO TABS
1.0000 mg | ORAL_TABLET | Freq: Every day | ORAL | Status: DC
  Administered 2018-03-24: 1 mg via ORAL
  Filled 2018-03-24: qty 1

## 2018-03-24 MED ORDER — ZOLPIDEM TARTRATE 5 MG PO TABS
5.0000 mg | ORAL_TABLET | Freq: Once | ORAL | Status: AC
  Administered 2018-03-24: 5 mg via ORAL
  Filled 2018-03-24: qty 1

## 2018-03-24 MED ORDER — ONDANSETRON HCL 4 MG PO TABS: 4.0000 mg | ORAL_TABLET | Freq: Four times a day (QID) | ORAL | Status: DC | PRN

## 2018-03-24 SURGICAL SUPPLY — 21 items
BAG URO CATCHER STRL LF (MISCELLANEOUS) ×2 IMPLANT
BASKET STONE NCOMPASS (UROLOGICAL SUPPLIES) IMPLANT
CATH URET 5FR 28IN OPEN ENDED (CATHETERS) IMPLANT
CATH URET DUAL LUMEN 6-10FR 50 (CATHETERS) ×2 IMPLANT
CLOTH BEACON ORANGE TIMEOUT ST (SAFETY) ×2 IMPLANT
EXTRACTOR STONE NITINOL NGAGE (UROLOGICAL SUPPLIES) ×2 IMPLANT
FIBER LASER FLEXIVA 1000 (UROLOGICAL SUPPLIES) IMPLANT
FIBER LASER FLEXIVA 365 (UROLOGICAL SUPPLIES) IMPLANT
FIBER LASER FLEXIVA 550 (UROLOGICAL SUPPLIES) IMPLANT
FIBER LASER TRAC TIP (UROLOGICAL SUPPLIES) IMPLANT
GLOVE SURG SS PI 8.0 STRL IVOR (GLOVE) IMPLANT
GOWN STRL REUS W/TWL XL LVL3 (GOWN DISPOSABLE) ×2 IMPLANT
GUIDEWIRE STR DUAL SENSOR (WIRE) ×2 IMPLANT
IV NS 1000ML (IV SOLUTION) ×1
IV NS 1000ML BAXH (IV SOLUTION) ×1 IMPLANT
IV NS IRRIG 3000ML ARTHROMATIC (IV SOLUTION) ×2 IMPLANT
MANIFOLD NEPTUNE II (INSTRUMENTS) ×2 IMPLANT
PACK CYSTO (CUSTOM PROCEDURE TRAY) ×2 IMPLANT
SHEATH URETERAL 12FRX35CM (MISCELLANEOUS) ×2 IMPLANT
TUBING CONNECTING 10 (TUBING) ×2 IMPLANT
TUBING UROLOGY SET (TUBING) ×2 IMPLANT

## 2018-03-24 NOTE — ED Notes (Addendum)
ED TO INPATIENT HANDOFF REPORT  Name/Age/Gender Leslie Fitzgerald 52 y.o. female  Code Status    Code Status Orders  (From admission, onward)         Start     Ordered   03/24/18 0203  Full code  Continuous     03/24/18 0207        Code Status History    Date Active Date Inactive Code Status Order ID Comments User Context   03/02/2014 1236 03/04/2014 1205 Full Code [id number]  Phebe Colla, MD Inpatient      Home/SNF/Other admission to floor  Chief Complaint Kidney Stone   Level of Care/Admitting Diagnosis ED Disposition    ED Disposition Condition [place]: [facility] [[id number]]  Level of Care: Telemetry [5]  Admit to tele based on following criteria: Complex arrhythmia (Bradycardia/Tachycardia)  Diagnosis: UTI (urinary tract infection) [233435]  Admitting Physician: Etta Quill [id number]  Attending Physician: Etta Quill [[id number]]  PT Class (Do Not Modify): Observation [104]  PT Acc Code (Do Not Modify): Observation [10022]       Medical History Past Medical History:  Diagnosis Date  . History of colon polyps   . History of kidney stones   . History of nonmelanoma skin cancer   . Nephrolithiasis    BILATERAL  . PONV (postoperative nausea and vomiting)   . Pyelonephritis   . Right ureteral stone     Allergies No Known Allergies  IV Location/Drains/Wounds Patient Lines/Drains/Airways Status   Active Line/Drains/Airways    Name:   Placement date:   Placement time:   Site:   Days:   Peripheral IV 03/23/18 Left Antecubital   03/23/18    2225    Antecubital   1   Ureteral Drain/Stent Left ureter    02/18/12    1240    Left ureter   2226   Incision 02/18/12 Perineum   02/18/12    1156     2226   Incision (Closed) 03/15/14 Perineum Right   03/15/14    0903     1470          Labs/Imaging Results for orders placed or performed during the hospital encounter of 03/23/18 (from the past 48 hour(s))  Comprehensive  metabolic panel     Status: Abnormal   Collection Time: 03/23/18  9:49 PM  Result Value Ref Range   Sodium 143 135 - 145 mmol/L   Potassium 3.7 3.5 - 5.1 mmol/L   Chloride 105 98 - 111 mmol/L   CO2 26 22 - 32 mmol/L   Glucose, Bld 101 (H) 70 - 99 mg/dL   BUN 20 6 - 20 mg/dL   Creatinine, Ser 1.26 (H) 0.44 - 1.00 mg/dL   Calcium 9.5 8.9 - 10.3 mg/dL   Total Protein 7.3 6.5 - 8.1 g/dL   Albumin 3.8 3.5 - 5.0 g/dL   AST 20 15 - 41 U/L   ALT 14 0 - 44 U/L   Alkaline Phosphatase 77 38 - 126 U/L   Total Bilirubin 0.7 0.3 - 1.2 mg/dL   GFR calc non Af Amer 49 (L) >60 mL/min   GFR calc Af Amer 57 (L) >60 mL/min    Comment: (NOTE) The eGFR has been calculated using the CKD EPI equation. This calculation has not been validated in all clinical situations. eGFR's persistently <60 mL/min signify possible Chronic Kidney Disease.    Anion gap 12 5 - 15  Comment: Performed at [facility], [place] [address], [place], [place] [address]  CBC with Differential     Status: None   Collection Time: 03/23/18  9:49 PM  Result Value Ref Range   WBC 5.2 4.0 - 10.5 K/uL   RBC 4.63 3.87 - 5.11 MIL/uL   Hemoglobin 14.2 12.0 - 15.0 g/dL   HCT 42.2 36.0 - 46.0 %   MCV 91.1 78.0 - 100.0 fL   MCH 30.7 26.0 - 34.0 pg   MCHC 33.6 30.0 - 36.0 g/dL   RDW 12.8 11.5 - 15.5 %   Platelets 163 150 - 400 K/uL   Neutrophils Relative % 59 %   Neutro Abs 3.1 1.7 - 7.7 K/uL   Lymphocytes Relative 28 %   Lymphs Abs 1.5 0.7 - 4.0 K/uL   Monocytes Relative 11 %   Monocytes Absolute 0.6 0.1 - 1.0 K/uL   Eosinophils Relative 2 %   Eosinophils Absolute 0.1 0.0 - 0.7 K/uL   Basophils Relative 0 %   Basophils Absolute 0.0 0.0 - 0.1 K/uL    Comment: Performed at [facility], [place] [address], [place], [place] [address]  Urinalysis, Routine w reflex microscopic     Status: Abnormal   Collection Time: 03/23/18  9:49 PM  Result Value Ref Range   Color, Urine YELLOW YELLOW   APPearance  HAZY (A) CLEAR   Specific Gravity, Urine 1.013 1.005 - 1.030   pH 8.0 5.0 - 8.0   Glucose, UA NEGATIVE NEGATIVE mg/dL   Hgb urine dipstick SMALL (A) NEGATIVE   Bilirubin Urine NEGATIVE NEGATIVE   Ketones, ur NEGATIVE NEGATIVE mg/dL   Protein, ur NEGATIVE NEGATIVE mg/dL   Nitrite NEGATIVE NEGATIVE   Leukocytes, UA MODERATE (A) NEGATIVE   RBC / HPF 6-10 0 - 5 RBC/hpf   WBC, UA 21-50 0 - 5 WBC/hpf   Bacteria, UA RARE (A) NONE SEEN   Squamous Epithelial / LPF 0-5 0 - 5   Amorphous Crystal PRESENT     Comment: Performed at [facility], [place] [address], [place], [place] [address]  I-Stat beta hCG blood, ED     Status: None   Collection Time: 03/23/18 10:39 PM  Result Value Ref Range   I-stat hCG, quantitative <5.0 <5 mIU/mL   Comment 3            Comment:   GEST. AGE      CONC.  (mIU/mL)   <=1 WEEK        5 - 50     2 WEEKS       50 - 500     3 WEEKS       100 - 10,000     4 WEEKS     1,000 - 30,000        FEMALE AND NON-PREGNANT FEMALE:     LESS THAN 5 mIU/mL   I-Stat CG4 Lactic Acid, ED     Status: None   Collection Time: 03/23/18 10:42 PM  Result Value Ref Range   Lactic Acid, Venous 1.63 0.5 - 1.9 mmol/L   Ct Renal Stone Study  Result Date: 03/24/2018 CLINICAL DATA:  Left flank pain.  History of kidney stones. EXAM: CT ABDOMEN AND PELVIS WITHOUT CONTRAST TECHNIQUE: Multidetector CT imaging of the abdomen and pelvis was performed following the standard protocol without IV contrast. COMPARISON:  Most recent CT 03/02/2014, additional priors including 03/24/2012 FINDINGS: Lower chest: Hypoventilatory atelectasis in the lung bases. 4  mm subpleural nodule in the left lower lobe, unchanged from 03/24/2012 CT and considered benign. Hepatobiliary: 19 mm cyst in the right lobe of the liver. Additional scattered hepatic hypodensities are incompletely characterized in the absence of IV contrast but likely cysts or hemangiomas. Gallbladder physiologically distended, no calcified  stone. No biliary dilatation. Pancreas: No ductal dilatation or inflammation. Spleen: Normal in size without focal abnormality. Adrenals/Urinary Tract: Normal adrenal glands. Obstructing 5 x 6 mm stone in the left mid ureter with resultant moderate hydroureteronephrosis and mild perinephric edema. Multiple additional nonobstructing stones within the left kidney. Chronic thinning of left renal parenchyma. Multiple nonobstructing stones in the right kidney. No right hydronephrosis or hydroureter. No right ureteral calculi. Urinary bladder is physiologically distended without wall thickening or bladder stone. Stomach/Bowel: Stomach physiologically distended. No bowel wall thickening, inflammatory change or obstruction. Mild distal colonic diverticulosis without diverticulitis. Appendix not confidently visualized. Vascular/Lymphatic: Abdominal aorta is normal in caliber. Small retroperitoneal nodes likely reactive. No enlarged lymph nodes in the abdomen or pelvis. Reproductive: Uterus and bilateral adnexa are unremarkable. Other: No free air, free fluid, or intra-abdominal fluid collection. Small fat containing umbilical hernia. Musculoskeletal: There are no acute or suspicious osseous abnormalities. IMPRESSION: 1. Obstructing 6 mm stone in the left mid ureter with resultant hydronephrosis. 2. Multiple nonobstructing stones in both kidneys. Chronic left renal parenchymal thinning. 3. Distal colonic diverticulosis without diverticulitis. Electronically Signed   By: Keith Rake M.D.   On: 03/24/2018 00:05    Pending Labs Unresulted Labs (From admission, onward)    Start     Ordered   03/24/18 0500  CBC  Tomorrow morning,   R     03/24/18 0207   03/24/18 8466  Basic metabolic panel  Tomorrow morning,   R     03/24/18 0207   03/24/18 0201  HIV antibody (Routine Testing)  Once,   R     03/24/18 0207   03/23/18 2205  Urine culture  STAT,   STAT     03/23/18 2204          Vitals/Pain Today's Vitals    03/24/18 0130 03/24/18 0155 03/24/18 0157 03/24/18 0158  BP: 112/71   124/78  Pulse: (!) 105   97  Resp:    16  Temp:    98.7 F (37.1 C)  TempSrc:    Oral  SpO2: 92% 99%  95%  Weight:      Height:      PainSc:   0-No pain     Isolation Precautions No active isolations  Medications Medications  ketorolac (TORADOL) 30 MG/ML injection 30 mg (has no administration in time range)  clonazePAM (KLONOPIN) tablet 1 mg (has no administration in time range)  acetaminophen (TYLENOL) tablet 650 mg (has no administration in time range)    Or  acetaminophen (TYLENOL) suppository 650 mg (has no administration in time range)  ondansetron (ZOFRAN) tablet 4 mg (has no administration in time range)    Or  ondansetron (ZOFRAN) injection 4 mg (has no administration in time range)  cefTRIAXone (ROCEPHIN) 1 g in sodium chloride 0.9 % 100 mL IVPB (has no administration in time range)  HYDROmorphone (DILAUDID) injection 1 mg (has no administration in time range)  sodium chloride 0.9 % bolus 2,000 mL (0 mLs Intravenous Stopped 03/24/18 0100)  HYDROmorphone (DILAUDID) injection 1 mg (1 mg Intravenous Given 03/23/18 2232)  ondansetron (ZOFRAN) injection 4 mg (4 mg Intravenous Given 03/23/18 2233)  cefTRIAXone (ROCEPHIN) 2 g in sodium chloride 0.9 %  100 mL IVPB (0 g Intravenous Stopped 03/24/18 0028)  ketorolac (TORADOL) 30 MG/ML injection 30 mg (30 mg Intravenous Given 03/23/18 2341)  acetaminophen (TYLENOL) tablet 1,000 mg (1,000 mg Oral Given 03/24/18 0045)  sodium chloride 0.9 % bolus 500 mL (0 mLs Intravenous Stopped 03/24/18 0226)    Mobility walks

## 2018-03-24 NOTE — Op Note (Addendum)
PATIENT:  Leslie Fitzgerald  Preoperative diagnosis:  1. Left ureterolithiasis   Postoperative diagnosis:  1. Left ureterolithiasis   Procedure:  1. Cystoscopy 2. Left ureteral stent placement (38F x 24 cm JJ) without string 3. Left retrograde pyelography with interpretation   Surgeon: Irine Seal, M.D.      Basilio Cairo, M.D. - Assisting  Anesthesia: General  Complications: None  EBL: Minimal  Specimens: None  Indication: Leslie Fitzgerald is a 52 y.o. female with an obstructing 5 mm left mid ureteral stone with suspicion for UTI. After reviewing the management options for treatment, they have elected to proceed with the above surgical procedure(s). We have discussed the potential benefits and risks of the procedure, side effects of the proposed treatment, the likelihood of the patient achieving the goals of the procedure, and any potential problems that might occur during the procedure or recuperation. Informed consent has been obtained.  Findings:  1. Mild follicular cystitis of posterior bladder wall 2. Purulent left hydronephrotic drip and ureteral efflux - suspicious for underlying infection 3. Uncomplicated left ureteral stent placement without string 4. Stone not easily visualized by radiograph alone 5. Left RPG demonstrated hydroureteronephrosis from level of mid ureter  Description of procedure:   The patient was taken to the operating room and general anesthesia was induced.  The patient was placed in the dorsal lithotomy position, prepped and draped in the usual sterile fashion, and preoperative antibiotics were administered. A preoperative time-out was performed.   Cystourethroscopy was performed.  The patient's urethra was examined and was normal. The bladder was then systematically examined in its entirety. There was no evidence for any bladder tumors, stones. The above findings were noted.   Attention then turned to the left ureteral orifice and a ureteral catheter was  used to intubate the ureteral orifice.  Omnipaque contrast was injected through the ureteral catheter and a retrograde pyelogram which revealed the above findings.  A Sensor guidewire was then advanced up the left ureter into the renal pelvis under fluoroscopic guidance.  The open ended was advanced an a hydronephrotic drip was collected. This appeared turbid. This was sent for urine culture. The wire was then backloaded through the cystoscope and a ureteral stent was advance over the wire using Seldinger technique.  The stent was positioned appropriately under fluoroscopic and cystoscopic guidance.  The wire was then removed with an adequate stent curl noted in the renal pelvis as well as in the bladder.  The bladder was then emptied and the procedure ended.  The patient appeared to tolerate the procedure well and without complications.  The patient was able to be awakened and transferred to the recovery unit in satisfactory condition.   Plan Observe overnight Recommend empiric treatment for UTI - follow up urine culture results and tailor antibiotics accordingly We will arrange outpatient follow up and discussion of definitive stone management

## 2018-03-24 NOTE — Transfer of Care (Signed)
Immediate Anesthesia Transfer of Care Note  Patient: Leslie Fitzgerald  Procedure(s) Performed: CYSTOSCOPY/RETROGRADE  LEFT STENT PLACEMENT (Left Ureter)  Patient Location: PACU  Anesthesia Type:General  Level of Consciousness: awake, alert , oriented and patient cooperative  Airway & Oxygen Therapy: Patient Spontanous Breathing and Patient connected to face mask oxygen  Post-op Assessment: Report given to RN and Post -op Vital signs reviewed and stable  Post vital signs: Reviewed and stable  Last Vitals:  Vitals Value Taken Time  BP 117/65 03/24/2018  6:15 PM  Temp 36.9 C 03/24/2018  6:15 PM  Pulse 108 03/24/2018  6:18 PM  Resp 25 03/24/2018  6:18 PM  SpO2 97 % 03/24/2018  6:18 PM  Vitals shown include unvalidated device data.  Last Pain:  Vitals:   03/24/18 1815  TempSrc:   PainSc: 0-No pain      Patients Stated Pain Goal: 2 (09/62/83 6629)  Complications: No apparent anesthesia complications

## 2018-03-24 NOTE — Anesthesia Preprocedure Evaluation (Addendum)
Anesthesia Evaluation  Patient identified by MRN, date of birth, ID band Patient awake    Reviewed: Allergy & Precautions, NPO status , Patient's Chart, lab work & pertinent test results  History of Anesthesia Complications (+) PONV  Airway Mallampati: II  TM Distance: >3 FB Neck ROM: Full    Dental no notable dental hx.    Pulmonary neg pulmonary ROS,    Pulmonary exam normal breath sounds clear to auscultation       Cardiovascular negative cardio ROS Normal cardiovascular exam Rhythm:Regular Rate:Normal     Neuro/Psych negative neurological ROS  negative psych ROS   GI/Hepatic negative GI ROS, Neg liver ROS,   Endo/Other  negative endocrine ROS  Renal/GU negative Renal ROS  negative genitourinary   Musculoskeletal negative musculoskeletal ROS (+)   Abdominal   Peds negative pediatric ROS (+)  Hematology negative hematology ROS (+)   Anesthesia Other Findings   Reproductive/Obstetrics negative OB ROS                            Lab Results  Component Value Date   WBC 5.4 03/24/2018   HGB 12.4 03/24/2018   HCT 37.9 03/24/2018   MCV 91.5 03/24/2018   PLT 137 (L) 03/24/2018   Lab Results  Component Value Date   CREATININE 1.02 (H) 03/24/2018   BUN 15 03/24/2018   NA 143 03/24/2018   K 4.1 03/24/2018   CL 110 03/24/2018   CO2 28 03/24/2018   No results found for: INR, PROTIME   Anesthesia Physical Anesthesia Plan  ASA: II  Anesthesia Plan: General   Post-op Pain Management:    Induction: Intravenous  PONV Risk Score and Plan: 4 or greater and Ondansetron, Dexamethasone and Midazolam  Airway Management Planned: LMA  Additional Equipment: None  Intra-op Plan:   Post-operative Plan: Extubation in OR  Informed Consent:   Plan Discussed with:   Anesthesia Plan Comments:         Anesthesia Quick Evaluation

## 2018-03-24 NOTE — Progress Notes (Signed)
Patient seen and examined at bedside, patient admitted after midnight, please see earlier detailed admission note by Etta Quill, DO. Briefly, patient presented with flank pain, tachycardia and a mildly elevated WBC. Found to have an obstructed stone. Urology consulted and planning for procedure this evening.   Cordelia Poche, MD [facility] 03/24/2018, 12:58 PM

## 2018-03-24 NOTE — H&P (Signed)
History and Physical    Leslie Fitzgerald NFA:[id number] DOB: February 20, 1966 DOA: 03/23/2018  PCP: Record, Charles  Patient coming from: Home  I have personally briefly reviewed patient's old medical records in [place]  Chief Complaint: L flank pain, fever  HPI: Leslie Fitzgerald is a 52 y.o. female with medical history significant of recurrent kidney stones (she estimates about 20 in total), one prior infected stone in 2015.  Patient presents to ED for evaluation of L flank pain and fever and chills at home.  Tm at home 100.2 PTA.  Flank pain onset Wed, Fever onset yesterday.  Flank pain non-radiating, intermittent, feels like kidney stone.  Patient became concerned for infected stone and presents to ED.   ED Course: In ED: CT scan confirms L obstructing stone.  UA suspicious for UTI.  Patient started on rocephin.  Initially EDP spoke with Urology who thought patient could go home and follow up in office; however, atient has remained borderline tachycardic, no WBC, Tm in ED 99.9.  Thus EDP requests medicine admission for observation at least.   Review of Systems: As per HPI otherwise 10 point review of systems negative.   Past Medical History:  Diagnosis Date  . History of colon polyps   . History of kidney stones   . History of nonmelanoma skin cancer   . Nephrolithiasis    BILATERAL  . PONV (postoperative nausea and vomiting)   . Pyelonephritis   . Right ureteral stone     Past Surgical History:  Procedure Laterality Date  . CESAREAN SECTION  02-12-2003   PRIMARY  . COLONOSCOPY W/ POLYPECTOMY  03/ 2015  . CYSTO/ LEFT RETROGRADE PYELOGRAM/ LEFT URETEROSCOPIC LASER LITHO/ STONE EXTRACTIONS  03-03-2008  . CYSTOSCOPY W/ URETERAL STENT PLACEMENT  02/18/2012   Procedure: CYSTOSCOPY WITH RETROGRADE PYELOGRAM/URETERAL STENT PLACEMENT;  Surgeon: Bernestine Amass, MD;  Location[facility];  Service: Urology;  Laterality: Left;  1 HR   . CYSTOSCOPY W/ URETERAL STENT  REMOVAL Right 03/15/2014   Procedure: CYSTOSCOPY WITH STENT REMOVAL;  Surgeon: Bernestine Amass, MD;  Location: [facility];  Service: Urology;  Laterality: Right;  . CYSTOSCOPY WITH URETEROSCOPY AND STENT PLACEMENT Right 03/02/2014   Procedure: CYSTOSCOPY WITH RIGHT RETROGRADE AND STENT PLACEMENT;  Surgeon: Alexis Frock, MD;  Location: [facility];  Service: Urology;  Laterality: Right;  . D & C HYSTEROSCOPY/ NOVASURE ENDOMETRIAL ABLATION  09-11-2006   REFACTORY MENORRHAGIA  . HOLMIUM LASER APPLICATION Right 4/69/6295   Procedure: HOLMIUM LASER APPLICATION;  Surgeon: Bernestine Amass, MD;  Location: [facility];  Service: Urology;  Laterality: Right;  . HYSTEROSCOPY W/D&C  10-19-2002   AUB  . RIGHT URETEROSCOPIC STONE EXTRACTIONS  03-28-2011  . URETEROSCOPY  02/18/2012   Procedure: URETEROSCOPY;  Surgeon: Bernestine Amass, MD;  Location: [facility];  Service: Urology;  Laterality: Left;  . URETEROSCOPY Right 03/15/2014   Procedure: URETEROSCOPY;  Surgeon: Bernestine Amass, MD;  Location: [facility];  Service: Urology;  Laterality: Right;     reports that she has never smoked. She has never used smokeless tobacco. She reports that she drinks alcohol. She reports that she does not use drugs.  No Known Allergies  Family History  Problem Relation Age of Onset  . Breast cancer Sister   . Skin cancer Sister   . Skin cancer Father   . Heart attack Father   . Colon cancer Neg Hx   .  Stomach cancer Neg Hx      Prior to Admission medications   Medication Sig Start Date End Date Taking? Authorizing Provider  clonazePAM (KLONOPIN) 1 MG tablet Take 1 mg by mouth at bedtime. 03/12/18  Yes [provider]  ibuprofen (ADVIL,MOTRIN) 200 MG tablet Take 600 mg by mouth every 6 (six) hours as needed (pain.).   Yes [provider]  MAGNESIUM PO Take 1 tablet by mouth 2 (two) times daily.   Yes [provider]    Physical  Exam: Vitals:   03/24/18 0115 03/24/18 0130 03/24/18 0155 03/24/18 0158  BP:  112/71  124/78  Pulse: (!) 106 (!) 105  97  Resp:    16  Temp:    98.7 F (37.1 C)  TempSrc:    Oral  SpO2: (!) 89% 92% 99% 95%  Weight:      Height:        Constitutional: NAD, calm, comfortable Eyes: PERRL, lids and conjunctivae normal ENMT: Mucous membranes are moist. Posterior pharynx clear of any exudate or lesions.Normal dentition.  Neck: normal, supple, no masses, no thyromegaly Respiratory: clear to auscultation bilaterally, no wheezing, no crackles. Normal respiratory effort. No accessory muscle use.  Cardiovascular: Regular rate and rhythm, no murmurs / rubs / gallops. No extremity edema. 2+ pedal pulses. No carotid bruits.  Abdomen: no tenderness, no masses palpated. No hepatosplenomegaly. Bowel sounds positive.  Musculoskeletal: no clubbing / cyanosis. No joint deformity upper and lower extremities. Good ROM, no contractures. Normal muscle tone.  Skin: no rashes, lesions, ulcers. No induration Neurologic: CN 2-12 grossly intact. Sensation intact, DTR normal. Strength 5/5 in all 4.  Psychiatric: Normal judgment and insight. Alert and oriented x 3. Normal mood.    Labs on Admission: I have personally reviewed following labs and imaging studies  CBC: Recent Labs  Lab 03/23/18 2149  WBC 5.2  NEUTROABS 3.1  HGB 14.2  HCT 42.2  MCV 91.1  PLT 737   Basic Metabolic Panel: Recent Labs  Lab 03/23/18 2149  NA 143  K 3.7  CL 105  CO2 26  GLUCOSE 101*  BUN 20  CREATININE 1.26*  CALCIUM 9.5   GFR: Estimated Creatinine Clearance: 53.4 mL/min (A) (by C-G formula based on SCr of 1.26 mg/dL (H)). Liver Function Tests: Recent Labs  Lab 03/23/18 2149  AST 20  ALT 14  ALKPHOS 77  BILITOT 0.7  PROT 7.3  ALBUMIN 3.8   No results for input(s): LIPASE, AMYLASE in the last 168 hours. No results for input(s): AMMONIA in the last 168 hours. Coagulation Profile: No results for input(s):  INR, PROTIME in the last 168 hours. Cardiac Enzymes: No results for input(s): CKTOTAL, CKMB, CKMBINDEX, TROPONINI in the last 168 hours. BNP (last 3 results) No results for input(s): PROBNP in the last 8760 hours. HbA1C: No results for input(s): HGBA1C in the last 72 hours. CBG: No results for input(s): GLUCAP in the last 168 hours. Lipid Profile: No results for input(s): CHOL, HDL, LDLCALC, TRIG, CHOLHDL, LDLDIRECT in the last 72 hours. Thyroid Function Tests: No results for input(s): TSH, T4TOTAL, FREET4, T3FREE, THYROIDAB in the last 72 hours. Anemia Panel: No results for input(s): VITAMINB12, FOLATE, FERRITIN, TIBC, IRON, RETICCTPCT in the last 72 hours. Urine analysis:    Component Value Date/Time   COLORURINE YELLOW 03/23/2018 2149   APPEARANCEUR HAZY (A) 03/23/2018 2149   LABSPEC 1.013 03/23/2018 2149   PHURINE 8.0 03/23/2018 2149   GLUCOSEU NEGATIVE 03/23/2018 2149   HGBUR SMALL (A)  03/23/2018 2149   BILIRUBINUR NEGATIVE 03/23/2018 2149   [place] NEGATIVE 03/23/2018 2149   PROTEINUR NEGATIVE 03/23/2018 2149   NITRITE NEGATIVE 03/23/2018 2149   LEUKOCYTESUR MODERATE (A) 03/23/2018 2149    Radiological Exams on Admission: Ct Renal Stone Study  Result Date: 03/24/2018 CLINICAL DATA:  Left flank pain.  History of kidney stones. EXAM: CT ABDOMEN AND PELVIS WITHOUT CONTRAST TECHNIQUE: Multidetector CT imaging of the abdomen and pelvis was performed following the standard protocol without IV contrast. COMPARISON:  Most recent CT 03/02/2014, additional priors including 03/24/2012 FINDINGS: Lower chest: Hypoventilatory atelectasis in the lung bases. 4 mm subpleural nodule in the left lower lobe, unchanged from 03/24/2012 CT and considered benign. Hepatobiliary: 19 mm cyst in the right lobe of the liver. Additional scattered hepatic hypodensities are incompletely characterized in the absence of IV contrast but likely cysts or hemangiomas. Gallbladder physiologically distended, no  calcified stone. No biliary dilatation. Pancreas: No ductal dilatation or inflammation. Spleen: Normal in size without focal abnormality. Adrenals/Urinary Tract: Normal adrenal glands. Obstructing 5 x 6 mm stone in the left mid ureter with resultant moderate hydroureteronephrosis and mild perinephric edema. Multiple additional nonobstructing stones within the left kidney. Chronic thinning of left renal parenchyma. Multiple nonobstructing stones in the right kidney. No right hydronephrosis or hydroureter. No right ureteral calculi. Urinary bladder is physiologically distended without wall thickening or bladder stone. Stomach/Bowel: Stomach physiologically distended. No bowel wall thickening, inflammatory change or obstruction. Mild distal colonic diverticulosis without diverticulitis. Appendix not confidently visualized. Vascular/Lymphatic: Abdominal aorta is normal in caliber. Small retroperitoneal nodes likely reactive. No enlarged lymph nodes in the abdomen or pelvis. Reproductive: Uterus and bilateral adnexa are unremarkable. Other: No free air, free fluid, or intra-abdominal fluid collection. Small fat containing umbilical hernia. Musculoskeletal: There are no acute or suspicious osseous abnormalities. IMPRESSION: 1. Obstructing 6 mm stone in the left mid ureter with resultant hydronephrosis. 2. Multiple nonobstructing stones in both kidneys. Chronic left renal parenchymal thinning. 3. Distal colonic diverticulosis without diverticulitis. Electronically Signed   By: Keith Rake M.D.   On: 03/24/2018 00:05    EKG: Independently reviewed.  Assessment/Plan Active Problems:   Ureteral stone with hydronephrosis   UTI (urinary tract infection)    1. UTI with L obstructing stone - EDP concerned about borderline elevated temperature and persistent mild S.Tach in ED. 1. Rocephin 2. Admit to obs 3. Tele monitor for persistent borderline tachycardia 4. Call Urology back in AM, sooner if patient status  changes. 5. Will keep patient NPO just-in-case urology ends up wanting to do ureteral stent. 6. Repeat CBC and BMP in AM  DVT prophylaxis: SCDs Code Status: Full Family Communication: No family in room Disposition Plan: Home after admit Consults called: None Admission status: Place in [place] - for now   Etta Quill. DO [facility] Pager [phone] Only works nights!  If 7AM-7PM, please contact the primary day team physician taking care of patient  [email or web address] Password [email or web address]  03/24/2018, 2:13 AM

## 2018-03-24 NOTE — Anesthesia Procedure Notes (Addendum)
Procedure Name: LMA Insertion Date/Time: 03/24/2018 5:35 PM Performed by: West Pugh, CRNA Pre-anesthesia Checklist: Patient identified, Emergency Drugs available, Suction available, Patient being monitored and Timeout performed Patient Re-evaluated:Patient Re-evaluated prior to induction Oxygen Delivery Method: Circle system utilized Preoxygenation: Pre-oxygenation with 100% oxygen Induction Type: IV induction LMA: LMA with gastric port inserted LMA Size: 4.0 Number of attempts: 1 Placement Confirmation: positive ETCO2 and breath sounds checked- equal and bilateral Tube secured with: Tape Dental Injury: Teeth and Oropharynx as per pre-operative assessment

## 2018-03-24 NOTE — Consult Note (Signed)
Subjective: CC: Left flank pain.  Leslie Fitzgerald is a 52 yo WF who I was asked to see in consultation by Dr. Lonny Prude for a left ureteral stone with  Possible UTI.  She had the onset last Weds of some left flank pain.  The pain got severe over the weekend and she was in the ER last night.  She had fever and chills with flu like symptoms on Saturday night.  100.2 was her max temp.  She was found on CT to have a 2m mid ureteral stone with obstruction and small bilateral renal stones.  She had some left renal parenchymal thinning.  Her pain is controlled with medication and she has no fever on Rocephin.  Cultures are pending.  Her WBC and lactic acid were normal.   She had 21-50 WBC and 6-10 RBCs.  She has had multiple stones and has had ureteroscopy and ESWL in the past by Dr. GRisa Grill   She has had one stone with UTI 4 years ago.   ROS:  Review of Systems  Constitutional: Positive for chills, fever and malaise/fatigue.  Respiratory: Positive for shortness of breath (with dilaudid).   Cardiovascular: Negative for chest pain.  Gastrointestinal: Positive for nausea. Negative for vomiting.  Genitourinary: Positive for flank pain and frequency.  Neurological: Positive for headaches.  All other systems reviewed and are negative.   No Known Allergies  Past Medical History:  Diagnosis Date  . History of colon polyps   . History of kidney stones   . History of nonmelanoma skin cancer   . Nephrolithiasis    BILATERAL  . PONV (postoperative nausea and vomiting)   . Pyelonephritis   . Right ureteral stone     Past Surgical History:  Procedure Laterality Date  . CESAREAN SECTION  02-12-2003   PRIMARY  . COLONOSCOPY W/ POLYPECTOMY  03/ 2015  . CYSTO/ LEFT RETROGRADE PYELOGRAM/ LEFT URETEROSCOPIC LASER LITHO/ STONE EXTRACTIONS  03-03-2008  . CYSTOSCOPY W/ URETERAL STENT PLACEMENT  02/18/2012   Procedure: CYSTOSCOPY WITH RETROGRADE PYELOGRAM/URETERAL STENT PLACEMENT;  Surgeon: DBernestine Amass MD;  [facility]  Service: Urology;  Laterality: Left;  1 HR   . CYSTOSCOPY W/ URETERAL STENT REMOVAL Right 03/15/2014   Procedure: CYSTOSCOPY WITH STENT REMOVAL;  Surgeon: DBernestine Amass MD;  [facility]  Service: Urology;  Laterality: Right;  . CYSTOSCOPY WITH URETEROSCOPY AND STENT PLACEMENT Right 03/02/2014   Procedure: CYSTOSCOPY WITH RIGHT RETROGRADE AND STENT PLACEMENT;  Surgeon: TAlexis Frock MD;  Location: [facility];  Service: Urology;  Laterality: Right;  . D & C HYSTEROSCOPY/ NOVASURE ENDOMETRIAL ABLATION  09-11-2006   REFACTORY MENORRHAGIA  . HOLMIUM LASER APPLICATION Right 82/03/4708  Procedure: HOLMIUM LASER APPLICATION;  Surgeon: DBernestine Amass MD;  [facility]  Service: Urology;  Laterality: Right;  . HYSTEROSCOPY W/D&C  10-19-2002   AUB  . RIGHT URETEROSCOPIC STONE EXTRACTIONS  03-28-2011  . URETEROSCOPY  02/18/2012   Procedure: URETEROSCOPY;  Surgeon: DBernestine Amass MD;  [facility]  Service: Urology;  Laterality: Left;  . URETEROSCOPY Right 03/15/2014   Procedure: URETEROSCOPY;  Surgeon: DBernestine Amass MD;  [facility]  Service: Urology;  Laterality: Right;    Social History   Socioeconomic History  . Marital status: Married    Spouse name: Not on file  . Number of children: 4  . Years of education: Not on file  . Highest education level:  Not on file  Occupational History  . Occupation: CSR  Social Needs  . Financial resource strain: Not on file  . Food insecurity:    Worry: Not on file    Inability: Not on file  . Transportation needs:    Medical: Not on file    Non-medical: Not on file  Tobacco Use  . Smoking status: Never Smoker  . Smokeless tobacco: Never Used  Substance and Sexual Activity  . Alcohol use: Yes    Comment: occasional  . Drug use: No  . Sexual activity: Yes  Lifestyle  . Physical activity:    Days per week: Not on file     Minutes per session: Not on file  . Stress: Not on file  Relationships  . Social connections:    Talks on phone: Not on file    Gets together: Not on file    Attends religious service: Not on file    Active member of club or organization: Not on file    Attends meetings of clubs or organizations: Not on file    Relationship status: Not on file  . Intimate partner violence:    Fear of current or ex partner: Not on file    Emotionally abused: Not on file    Physically abused: Not on file    Forced sexual activity: Not on file  Other Topics Concern  . Not on file  Social History Narrative  . Not on file    Family History  Problem Relation Age of Onset  . Breast cancer Sister   . Skin cancer Sister   . Skin cancer Father   . Heart attack Father   . Colon cancer Neg Hx   . Stomach cancer Neg Hx     Anti-infectives: Anti-infectives (From admission, onward)   Start     Dose/Rate Route Frequency Ordered Stop   03/24/18 2230  cefTRIAXone (ROCEPHIN) 1 g in sodium chloride 0.9 % 100 mL IVPB     1 g 200 mL/hr over 30 Minutes Intravenous Every 24 hours 03/24/18 0207     03/23/18 2330  cefTRIAXone (ROCEPHIN) 2 g in sodium chloride 0.9 % 100 mL IVPB     2 g 200 mL/hr over 30 Minutes Intravenous  Once 03/23/18 2317 03/24/18 0028      Current Facility-Administered Medications  Medication Dose Route Frequency Provider Last Rate Last Dose  . acetaminophen (TYLENOL) tablet 650 mg  650 mg Oral Q6H PRN Etta Quill, DO       Or  . acetaminophen (TYLENOL) suppository 650 mg  650 mg Rectal Q6H PRN Etta Quill, DO      . cefTRIAXone (ROCEPHIN) 1 g in sodium chloride 0.9 % 100 mL IVPB  1 g Intravenous Q24H Alcario Drought, Jared M, DO      . clonazePAM Bobbye [place]) tablet 1 mg  1 mg Oral QHS Etta Quill, DO      . HYDROmorphone (DILAUDID) injection 1 mg  1 mg Intravenous Q4H PRN Etta Quill, DO   1 mg at 03/24/18 0949  . ketorolac (TORADOL) 30 MG/ML injection 30 mg  30 mg  Intravenous Q6H PRN Jennette Kettle M, DO   30 mg at 03/24/18 1101  . ondansetron (ZOFRAN) tablet 4 mg  4 mg Oral Q6H PRN Etta Quill, DO       Or  . ondansetron [facility]) injection 4 mg  4 mg Intravenous Q6H PRN Etta Quill, DO  Objective: Vital signs in last 24 hours: Temp:  [97.9 F (36.6 C)-99.8 F (37.7 C)] 97.9 F (36.6 C) (09/09 0947) Pulse Rate:  [86-111] 86 (09/09 0947) Resp:  [15-18] 17 (09/09 0241) BP: (106-150)/(47-86) 107/64 (09/09 0947) SpO2:  [89 %-100 %] 94 % (09/09 0947) Weight:  [83 kg-88.7 kg] 88.7 kg (09/09 0242)  Intake/Output from previous day: 09/08 0701 - 09/09 0700 In: 2601.8 [IV Piggyback:2601.8] Out: -  Intake/Output this shift: No intake/output data recorded.   Physical Exam  Constitutional: She is oriented to person, place, and time. She appears well-developed and well-nourished.  HENT:  Head: Normocephalic and atraumatic.  Neck: Normal range of motion. Neck supple. No thyromegaly present.  Cardiovascular: Normal rate, regular rhythm and normal heart sounds.  Pulmonary/Chest: Effort normal and breath sounds normal. No respiratory distress.  Abdominal: Soft. She exhibits no mass. There is no tenderness.  Musculoskeletal: Normal range of motion. She exhibits no edema or tenderness.  Lymphadenopathy:    She has no cervical adenopathy.  Neurological: She is alert and oriented to person, place, and time.  Skin: Skin is warm and dry.  Psychiatric: She has a normal mood and affect.    Lab Results:  Recent Labs    03/23/18 2149 03/24/18 0502  WBC 5.2 5.4  HGB 14.2 12.4  HCT 42.2 37.9  PLT 163 137*   BMET Recent Labs    03/23/18 2149 03/24/18 0502  NA 143 143  K 3.7 4.1  CL 105 110  CO2 26 28  GLUCOSE 101* 113*  BUN 20 15  CREATININE 1.26* 1.02*  CALCIUM 9.5 8.3*   PT/INR No results for input(s): LABPROT, INR in the last 72 hours. ABG No results for input(s): PHART, HCO3 in the last 72 hours.  Invalid input(s):  PCO2, PO2  Studies/Results: Ct Renal Stone Study  Result Date: 03/24/2018 CLINICAL DATA:  Left flank pain.  History of kidney stones. EXAM: CT ABDOMEN AND PELVIS WITHOUT CONTRAST TECHNIQUE: Multidetector CT imaging of the abdomen and pelvis was performed following the standard protocol without IV contrast. COMPARISON:  Most recent CT 03/02/2014, additional priors including 03/24/2012 FINDINGS: Lower chest: Hypoventilatory atelectasis in the lung bases. 4 mm subpleural nodule in the left lower lobe, unchanged from 03/24/2012 CT and considered benign. Hepatobiliary: 19 mm cyst in the right lobe of the liver. Additional scattered hepatic hypodensities are incompletely characterized in the absence of IV contrast but likely cysts or hemangiomas. Gallbladder physiologically distended, no calcified stone. No biliary dilatation. Pancreas: No ductal dilatation or inflammation. Spleen: Normal in size without focal abnormality. Adrenals/Urinary Tract: Normal adrenal glands. Obstructing 5 x 6 mm stone in the left mid ureter with resultant moderate hydroureteronephrosis and mild perinephric edema. Multiple additional nonobstructing stones within the left kidney. Chronic thinning of left renal parenchyma. Multiple nonobstructing stones in the right kidney. No right hydronephrosis or hydroureter. No right ureteral calculi. Urinary bladder is physiologically distended without wall thickening or bladder stone. Stomach/Bowel: Stomach physiologically distended. No bowel wall thickening, inflammatory change or obstruction. Mild distal colonic diverticulosis without diverticulitis. Appendix not confidently visualized. Vascular/Lymphatic: Abdominal aorta is normal in caliber. Small retroperitoneal nodes likely reactive. No enlarged lymph nodes in the abdomen or pelvis. Reproductive: Uterus and bilateral adnexa are unremarkable. Other: No free air, free fluid, or intra-abdominal fluid collection. Small fat containing umbilical hernia.  Musculoskeletal: There are no acute or suspicious osseous abnormalities. IMPRESSION: 1. Obstructing 6 mm stone in the left mid ureter with resultant hydronephrosis. 2. Multiple nonobstructing stones in both  kidneys. Chronic left renal parenchymal thinning. 3. Distal colonic diverticulosis without diverticulitis. Electronically Signed   By: Keith Rake M.D.   On: 03/24/2018 00:05   I have discussed her case with Dr. Lonny Prude.  I have reviewed her films and reports as well as her labs.  I have reviewed our prior office notes.    Assessment: 5x17m left mid ureteral stone with possible UTI.   I discussed MET vs ureteroscopy and stent.   She would like to get something done.   I will set her up for cystoscopy with left RTG, possible ureteroscopy with laser and stent.   If she has pyuria, I will just leave a stent and do ureteroscopy at a later date.   I reviewed the risks of bleeding, infection, ureteral injury, need for a stent and secondary procedures, thrombosis, ureteral stricture and anesthetic complications.     CC: Dr R. A.Jeanell Sparrow9/03/2018 [phone]

## 2018-03-25 ENCOUNTER — Encounter (HOSPITAL_COMMUNITY): Payer: Self-pay | Admitting: Urology

## 2018-03-25 DIAGNOSIS — R319 Hematuria, unspecified: Secondary | ICD-10-CM

## 2018-03-25 DIAGNOSIS — N132 Hydronephrosis with renal and ureteral calculous obstruction: Secondary | ICD-10-CM | POA: Diagnosis not present

## 2018-03-25 DIAGNOSIS — N390 Urinary tract infection, site not specified: Secondary | ICD-10-CM | POA: Diagnosis not present

## 2018-03-25 MED ORDER — CEFDINIR 300 MG PO CAPS: 300.0000 mg | ORAL_CAPSULE | Freq: Two times a day (BID) | ORAL | 0 refills | Status: AC

## 2018-03-25 NOTE — Discharge Instructions (Signed)

## 2018-03-25 NOTE — Discharge Summary (Signed)
Physician Discharge Summary  NAKEYIA Fitzgerald XVQ:[id number] DOB: [birth year] DOA: 03/23/2018  PCP: Record, Charles  Admit date: 03/23/2018 Discharge date: 03/25/2018  Admitted From: Home Disposition: Home  Recommendations for Outpatient Follow-up:  1. Follow up with this week 2. Please follow up on the following pending results: Urine culture  Home Health: None Equipment/Devices: None  Discharge Condition: Stable CODE STATUS: Full code Diet recommendation: Regular diet   Brief/Interim Summary:  Admission HPI written by Etta Quill, DO   Chief Complaint: L flank pain, fever  HPI: Leslie Fitzgerald is a 52 y.o. female with medical history significant of recurrent kidney stones (she estimates about 20 in total), one prior infected stone in 2015.  Patient presents to ED for evaluation of L flank pain and fever and chills at home.  Tm at home 100.2 PTA.  Flank pain onset Wed, Fever onset yesterday.  Flank pain non-radiating, intermittent, feels like kidney stone.  Patient became concerned for infected stone and presents to ED.   ED Course: In ED: CT scan confirms L obstructing stone.  UA suspicious for UTI.  Patient started on rocephin.    Hospital course:  UTI Empirically treated with Rocephin. Urine culture obtained during stent placement as is pending. Transitioned to Cefdinir on discharge. Urine culture pending on discharge.  Left obstructing kidney stone Patient had stent placed. Follow-up with urology as an outpatient.  Discharge Diagnoses:  Active Problems:   Ureteral stone with hydronephrosis   UTI (urinary tract infection)    Discharge Instructions  Discharge Instructions    Increase activity slowly   Complete by:  As directed      Allergies as of 03/25/2018   No Known Allergies     Medication List    TAKE these medications   cefdinir 300 MG capsule Commonly known as:  OMNICEF Take 1 capsule (300 mg total) by mouth 2 (two) times daily for 5  days.   clonazePAM 1 MG tablet Commonly known as:  KLONOPIN Take 1 mg by mouth at bedtime.   ibuprofen 200 MG tablet Commonly known as:  ADVIL,MOTRIN Take 600 mg by mouth every 6 (six) hours as needed (pain.).   MAGNESIUM PO Take 1 tablet by mouth 2 (two) times daily.      Follow-up Information    Irine Seal, MD.   Specialty:  Urology Why:  We will contact you with scheduling for the next procedure.   If you haven't heard from [place] by Thursday PM, please call.  Contact information: [address] [place] [address] [phone]          No Known Allergies  Consultations:  Urology   Procedures/Studies: Dg C-arm 1-60 Min-no Report  Result Date: 03/24/2018 Fluoroscopy was utilized by the requesting physician.  No radiographic interpretation.   Ct Renal Stone Study  Result Date: 03/24/2018 CLINICAL DATA:  Left flank pain.  History of kidney stones. EXAM: CT ABDOMEN AND PELVIS WITHOUT CONTRAST TECHNIQUE: Multidetector CT imaging of the abdomen and pelvis was performed following the standard protocol without IV contrast. COMPARISON:  Most recent CT 03/02/2014, additional priors including 03/24/2012 FINDINGS: Lower chest: Hypoventilatory atelectasis in the lung bases. 4 mm subpleural nodule in the left lower lobe, unchanged from 03/24/2012 CT and considered benign. Hepatobiliary: 19 mm cyst in the right lobe of the liver. Additional scattered hepatic hypodensities are incompletely characterized in the absence of IV contrast but likely cysts or hemangiomas. Gallbladder physiologically distended, no calcified stone. No biliary dilatation. Pancreas:  No ductal dilatation or inflammation. Spleen: Normal in size without focal abnormality. Adrenals/Urinary Tract: Normal adrenal glands. Obstructing 5 x 6 mm stone in the left mid ureter with resultant moderate hydroureteronephrosis and mild perinephric edema. Multiple additional nonobstructing stones within the left kidney. Chronic thinning  of left renal parenchyma. Multiple nonobstructing stones in the right kidney. No right hydronephrosis or hydroureter. No right ureteral calculi. Urinary bladder is physiologically distended without wall thickening or bladder stone. Stomach/Bowel: Stomach physiologically distended. No bowel wall thickening, inflammatory change or obstruction. Mild distal colonic diverticulosis without diverticulitis. Appendix not confidently visualized. Vascular/Lymphatic: Abdominal aorta is normal in caliber. Small retroperitoneal nodes likely reactive. No enlarged lymph nodes in the abdomen or pelvis. Reproductive: Uterus and bilateral adnexa are unremarkable. Other: No free air, free fluid, or intra-abdominal fluid collection. Small fat containing umbilical hernia. Musculoskeletal: There are no acute or suspicious osseous abnormalities. IMPRESSION: 1. Obstructing 6 mm stone in the left mid ureter with resultant hydronephrosis. 2. Multiple nonobstructing stones in both kidneys. Chronic left renal parenchymal thinning. 3. Distal colonic diverticulosis without diverticulitis. Electronically Signed   By: Keith Rake M.D.   On: 03/24/2018 00:05      Subjective: Pain improved.  Discharge Exam: Vitals:   03/25/18 0136 03/25/18 0620  BP: 124/79 122/79  Pulse: 75 77  Resp: 17 17  Temp: (!) 97.5 F (36.4 C) 97.6 F (36.4 C)  SpO2: 98% 97%   Vitals:   03/24/18 1845 03/24/18 2123 03/25/18 0136 03/25/18 0620  BP: 130/75 115/70 124/79 122/79  Pulse: (!) 107 93 75 77  Resp: 16 18 17 17   Temp: 99.4 F (37.4 C) 98.2 F (36.8 C) (!) 97.5 F (36.4 C) 97.6 F (36.4 C)  TempSrc:      SpO2: 94% 95% 98% 97%  Weight:      Height:        General: Pt is alert, awake, not in acute distress   The results of significant diagnostics from this hospitalization (including imaging, microbiology, ancillary and laboratory) are listed below for reference.     Microbiology: Recent Results (from the past 240 hour(s))    Urine culture     Status: Abnormal (Preliminary result)   Collection Time: 03/23/18 10:05 PM  Result Value Ref Range Status   Specimen Description   Final    URINE, RANDOM Performed at [facility] [address], [place], [place] [address]    Special Requests   Final    NONE Performed at [facility], [place] [address], [place], [place] [address]    Culture (A)  Final    50,000 COLONIES/mL UNIDENTIFIED ORGANISM Performed at [facility], [place] [address] [place], [place] [address]    Report Status PENDING  Incomplete  MRSA PCR Screening     Status: None   Collection Time: 03/24/18 12:50 PM  Result Value Ref Range Status   MRSA by PCR NEGATIVE NEGATIVE Final    Comment:        The GeneXpert MRSA Assay (FDA approved for NASAL specimens only), is one component of a comprehensive MRSA colonization surveillance program. It is not intended to diagnose MRSA infection nor to guide or monitor treatment for MRSA infections. Performed at [facility], [place] [address], [place], [place] [address]      Labs: BNP (last 3 results) No results for input(s): BNP in the last 8760 hours. Basic Metabolic Panel: Recent Labs  Lab 03/23/18 2149 03/24/18 0502  NA 143  143  K 3.7 4.1  CL 105 110  CO2 26 28  GLUCOSE 101* 113*  BUN 20 15  CREATININE 1.26* 1.02*  CALCIUM 9.5 8.3*   Liver Function Tests: Recent Labs  Lab 03/23/18 2149  AST 20  ALT 14  ALKPHOS 77  BILITOT 0.7  PROT 7.3  ALBUMIN 3.8   No results for input(s): LIPASE, AMYLASE in the last 168 hours. No results for input(s): AMMONIA in the last 168 hours. CBC: Recent Labs  Lab 03/23/18 2149 03/24/18 0502  WBC 5.2 5.4  NEUTROABS 3.1  --   HGB 14.2 12.4  HCT 42.2 37.9  MCV 91.1 91.5  PLT 163 137*   Cardiac Enzymes: No results for input(s): CKTOTAL, CKMB, CKMBINDEX, TROPONINI in the last 168 hours. BNP: Invalid input(s): POCBNP CBG: No results for  input(s): GLUCAP in the last 168 hours. D-Dimer No results for input(s): DDIMER in the last 72 hours. Hgb A1c No results for input(s): HGBA1C in the last 72 hours. Lipid Profile No results for input(s): CHOL, HDL, LDLCALC, TRIG, CHOLHDL, LDLDIRECT in the last 72 hours. Thyroid function studies No results for input(s): TSH, T4TOTAL, T3FREE, THYROIDAB in the last 72 hours.  Invalid input(s): FREET3 Anemia work up No results for input(s): VITAMINB12, FOLATE, FERRITIN, TIBC, IRON, RETICCTPCT in the last 72 hours. Urinalysis    Component Value Date/Time   COLORURINE YELLOW 03/23/2018 2149   APPEARANCEUR HAZY (A) 03/23/2018 2149   LABSPEC 1.013 03/23/2018 2149   PHURINE 8.0 03/23/2018 2149   GLUCOSEU NEGATIVE 03/23/2018 2149   HGBUR SMALL (A) 03/23/2018 2149   BILIRUBINUR NEGATIVE 03/23/2018 2149   KETONESUR NEGATIVE 03/23/2018 2149   PROTEINUR NEGATIVE 03/23/2018 2149   NITRITE NEGATIVE 03/23/2018 2149   LEUKOCYTESUR MODERATE (A) 03/23/2018 2149   Sepsis Labs Invalid input(s): PROCALCITONIN,  WBC,  LACTICIDVEN Microbiology Recent Results (from the past 240 hour(s))  Urine culture     Status: Abnormal (Preliminary result)   Collection Time: 03/23/18 10:05 PM  Result Value Ref Range Status   Specimen Description   Final    URINE, RANDOM Performed at [facility], [place] [address] [place], [place] [address]    Special Requests   Final    NONE Performed at [facility], [place] [address] [place], [place] [address]    Culture (A)  Final    50,000 COLONIES/mL UNIDENTIFIED ORGANISM Performed at [facility], [place] [address], [place], [place] [address]    Report Status PENDING  Incomplete  MRSA PCR Screening     Status: None   Collection Time: 03/24/18 12:50 PM  Result Value Ref Range Status   MRSA by PCR NEGATIVE NEGATIVE Final    Comment:        The GeneXpert MRSA Assay (FDA approved for NASAL specimens only), is one component of  a comprehensive MRSA colonization surveillance program. It is not intended to diagnose MRSA infection nor to guide or monitor treatment for MRSA infections. Performed at [facility], [place] [address] [place], [place] [address]     SIGNED:   Cordelia Poche, MD [facility] 03/25/2018, 9:55 AM

## 2018-03-25 NOTE — Progress Notes (Signed)
1 Day Post-Op  Subjective: Leslie Fitzgerald is doing well post stent placement.  She is afebrile and has minimal pain.  Culture from 9/8 is still pending.    ROS:  Review of Systems  All other systems reviewed and are negative.   Anti-infectives: Anti-infectives (From admission, onward)   Start     Dose/Rate Route Frequency Ordered Stop   03/24/18 2230  cefTRIAXone (ROCEPHIN) 1 g in sodium chloride 0.9 % 100 mL IVPB     1 g 200 mL/hr over 30 Minutes Intravenous Every 24 hours 03/24/18 0207     03/24/18 1752  sodium chloride 0.9 % with cefTRIAXone (ROCEPHIN) ADS Med    Note to Pharmacy:  Mackie Pai   : cabinet override      03/24/18 1752 03/25/18 0559   03/23/18 2330  cefTRIAXone (ROCEPHIN) 2 g in sodium chloride 0.9 % 100 mL IVPB     2 g 200 mL/hr over 30 Minutes Intravenous  Once 03/23/18 2317 03/24/18 0028      Current Facility-Administered Medications  Medication Dose Route Frequency Provider Last Rate Last Dose  . 0.9 %  sodium chloride infusion   Intravenous PRN Mariel Aloe, MD   Stopped at 03/24/18 2235  . acetaminophen (TYLENOL) tablet 650 mg  650 mg Oral Q6H PRN Fredricka Bonine, MD       Or  . acetaminophen (TYLENOL) suppository 650 mg  650 mg Rectal Q6H PRN Fredricka Bonine, MD      . cefTRIAXone (ROCEPHIN) 1 g in sodium chloride 0.9 % 100 mL IVPB  1 g Intravenous Q24H Fredricka Bonine, MD   Stopped at 03/24/18 2219  . clonazePAM (KLONOPIN) tablet 1 mg  1 mg Oral QHS Fredricka Bonine, MD   1 mg at 03/24/18 2138  . HYDROmorphone (DILAUDID) injection 1 mg  1 mg Intravenous Q4H PRN Fredricka Bonine, MD   1 mg at 03/24/18 0949  . ketorolac (TORADOL) 30 MG/ML injection 30 mg  30 mg Intravenous Q6H PRN Fredricka Bonine, MD   30 mg at 03/24/18 1101  . lactated ringers infusion   Intravenous Continuous Arby Barrette A, NP 125 mL/hr at 03/25/18 0200    . ondansetron (ZOFRAN) tablet 4 mg  4 mg Oral Q6H PRN Fredricka Bonine, MD       Or  . ondansetron [facility])  injection 4 mg  4 mg Intravenous Q6H PRN Fredricka Bonine, MD         Objective: Vital signs in last 24 hours: Temp:  [97.5 F (36.4 C)-99.4 F (37.4 C)] 97.6 F (36.4 C) (09/10 0620) Pulse Rate:  [75-107] 77 (09/10 0620) Resp:  [15-18] 17 (09/10 0620) BP: (105-130)/(64-79) 122/79 (09/10 0620) SpO2:  [94 %-98 %] 97 % (09/10 0620) Weight:  [88.7 kg] 88.7 kg (09/09 1714)  Intake/Output from previous day: 09/09 0701 - 09/10 0700 In: 1564 [P.O.:300; I.V.:1164; IV Piggyback:100] Out: 1 [Blood:1] Intake/Output this shift: No intake/output data recorded.   Physical Exam  Constitutional: She appears well-developed and well-nourished.  Abdominal: Soft. There is tenderness (mild LUQ).  Vitals reviewed.   Lab Results:  Recent Labs    03/23/18 2149 03/24/18 0502  WBC 5.2 5.4  HGB 14.2 12.4  HCT 42.2 37.9  PLT 163 137*   BMET Recent Labs    03/23/18 2149 03/24/18 0502  NA 143 143  K 3.7 4.1  CL 105 110  CO2 26 28  GLUCOSE 101* 113*  BUN 20 15  CREATININE  1.26* 1.02*  CALCIUM 9.5 8.3*   PT/INR No results for input(s): LABPROT, INR in the last 72 hours. ABG No results for input(s): PHART, HCO3 in the last 72 hours.  Invalid input(s): PCO2, PO2  Studies/Results: Dg C-arm 1-60 Min-no Report  Result Date: 03/24/2018 Fluoroscopy was utilized by the requesting physician.  No radiographic interpretation.   Ct Renal Stone Study  Result Date: 03/24/2018 CLINICAL DATA:  Left flank pain.  History of kidney stones. EXAM: CT ABDOMEN AND PELVIS WITHOUT CONTRAST TECHNIQUE: Multidetector CT imaging of the abdomen and pelvis was performed following the standard protocol without IV contrast. COMPARISON:  Most recent CT 03/02/2014, additional priors including 03/24/2012 FINDINGS: Lower chest: Hypoventilatory atelectasis in the lung bases. 4 mm subpleural nodule in the left lower lobe, unchanged from 03/24/2012 CT and considered benign. Hepatobiliary: 19 mm cyst in the right lobe of  the liver. Additional scattered hepatic hypodensities are incompletely characterized in the absence of IV contrast but likely cysts or hemangiomas. Gallbladder physiologically distended, no calcified stone. No biliary dilatation. Pancreas: No ductal dilatation or inflammation. Spleen: Normal in size without focal abnormality. Adrenals/Urinary Tract: Normal adrenal glands. Obstructing 5 x 6 mm stone in the left mid ureter with resultant moderate hydroureteronephrosis and mild perinephric edema. Multiple additional nonobstructing stones within the left kidney. Chronic thinning of left renal parenchyma. Multiple nonobstructing stones in the right kidney. No right hydronephrosis or hydroureter. No right ureteral calculi. Urinary bladder is physiologically distended without wall thickening or bladder stone. Stomach/Bowel: Stomach physiologically distended. No bowel wall thickening, inflammatory change or obstruction. Mild distal colonic diverticulosis without diverticulitis. Appendix not confidently visualized. Vascular/Lymphatic: Abdominal aorta is normal in caliber. Small retroperitoneal nodes likely reactive. No enlarged lymph nodes in the abdomen or pelvis. Reproductive: Uterus and bilateral adnexa are unremarkable. Other: No free air, free fluid, or intra-abdominal fluid collection. Small fat containing umbilical hernia. Musculoskeletal: There are no acute or suspicious osseous abnormalities. IMPRESSION: 1. Obstructing 6 mm stone in the left mid ureter with resultant hydronephrosis. 2. Multiple nonobstructing stones in both kidneys. Chronic left renal parenchymal thinning. 3. Distal colonic diverticulosis without diverticulitis. Electronically Signed   By: Keith Rake M.D.   On: 03/24/2018 00:05     Assessment and Plan: Left mid ureteral stone with febrile UTI.  She is doing well post stenting and could be discharged home on oral antibiotics from my standpoint.  Culture is pending.   I will arrange left  ureteroscopy for next week if possible.         LOS: 0 days    Irine Seal 03/25/2018 [phone] ID: Melinda Crutch, female   DOB: 1965/12/29, 52 y.o.   MRN: [id number]

## 2018-03-25 NOTE — H&P (View-Only) (Signed)
1 Day Post-Op  Subjective: Leslie Fitzgerald is doing well post stent placement.  She is afebrile and has minimal pain.  Culture from 9/8 is still pending.    ROS:  Review of Systems  All other systems reviewed and are negative.   Anti-infectives: Anti-infectives (From admission, onward)   Start     Dose/Rate Route Frequency Ordered Stop   03/24/18 2230  cefTRIAXone (ROCEPHIN) 1 g in sodium chloride 0.9 % 100 mL IVPB     1 g 200 mL/hr over 30 Minutes Intravenous Every 24 hours 03/24/18 0207     03/24/18 1752  sodium chloride 0.9 % with cefTRIAXone (ROCEPHIN) ADS Med    Note to Pharmacy:  Mackie Pai   : cabinet override      03/24/18 1752 03/25/18 0559   03/23/18 2330  cefTRIAXone (ROCEPHIN) 2 g in sodium chloride 0.9 % 100 mL IVPB     2 g 200 mL/hr over 30 Minutes Intravenous  Once 03/23/18 2317 03/24/18 0028      Current Facility-Administered Medications  Medication Dose Route Frequency Provider Last Rate Last Dose  . 0.9 %  sodium chloride infusion   Intravenous PRN Mariel Aloe, MD   Stopped at 03/24/18 2235  . acetaminophen (TYLENOL) tablet 650 mg  650 mg Oral Q6H PRN Fredricka Bonine, MD       Or  . acetaminophen (TYLENOL) suppository 650 mg  650 mg Rectal Q6H PRN Fredricka Bonine, MD      . cefTRIAXone (ROCEPHIN) 1 g in sodium chloride 0.9 % 100 mL IVPB  1 g Intravenous Q24H Fredricka Bonine, MD   Stopped at 03/24/18 2219  . clonazePAM (KLONOPIN) tablet 1 mg  1 mg Oral QHS Fredricka Bonine, MD   1 mg at 03/24/18 2138  . HYDROmorphone (DILAUDID) injection 1 mg  1 mg Intravenous Q4H PRN Fredricka Bonine, MD   1 mg at 03/24/18 0949  . ketorolac (TORADOL) 30 MG/ML injection 30 mg  30 mg Intravenous Q6H PRN Fredricka Bonine, MD   30 mg at 03/24/18 1101  . lactated ringers infusion   Intravenous Continuous Arby Barrette A, NP 125 mL/hr at 03/25/18 0200    . ondansetron (ZOFRAN) tablet 4 mg  4 mg Oral Q6H PRN Fredricka Bonine, MD       Or  . ondansetron [facility])  injection 4 mg  4 mg Intravenous Q6H PRN Fredricka Bonine, MD         Objective: Vital signs in last 24 hours: Temp:  [97.5 F (36.4 C)-99.4 F (37.4 C)] 97.6 F (36.4 C) (09/10 0620) Pulse Rate:  [75-107] 77 (09/10 0620) Resp:  [15-18] 17 (09/10 0620) BP: (105-130)/(64-79) 122/79 (09/10 0620) SpO2:  [94 %-98 %] 97 % (09/10 0620) Weight:  [88.7 kg] 88.7 kg (09/09 1714)  Intake/Output from previous day: 09/09 0701 - 09/10 0700 In: 1564 [P.O.:300; I.V.:1164; IV Piggyback:100] Out: 1 [Blood:1] Intake/Output this shift: No intake/output data recorded.   Physical Exam  Constitutional: She appears well-developed and well-nourished.  Abdominal: Soft. There is tenderness (mild LUQ).  Vitals reviewed.   Lab Results:  Recent Labs    03/23/18 2149 03/24/18 0502  WBC 5.2 5.4  HGB 14.2 12.4  HCT 42.2 37.9  PLT 163 137*   BMET Recent Labs    03/23/18 2149 03/24/18 0502  NA 143 143  K 3.7 4.1  CL 105 110  CO2 26 28  GLUCOSE 101* 113*  BUN 20 15  CREATININE  1.26* 1.02*  CALCIUM 9.5 8.3*   PT/INR No results for input(s): LABPROT, INR in the last 72 hours. ABG No results for input(s): PHART, HCO3 in the last 72 hours.  Invalid input(s): PCO2, PO2  Studies/Results: Dg C-arm 1-60 Min-no Report  Result Date: 03/24/2018 Fluoroscopy was utilized by the requesting physician.  No radiographic interpretation.   Ct Renal Stone Study  Result Date: 03/24/2018 CLINICAL DATA:  Left flank pain.  History of kidney stones. EXAM: CT ABDOMEN AND PELVIS WITHOUT CONTRAST TECHNIQUE: Multidetector CT imaging of the abdomen and pelvis was performed following the standard protocol without IV contrast. COMPARISON:  Most recent CT 03/02/2014, additional priors including 03/24/2012 FINDINGS: Lower chest: Hypoventilatory atelectasis in the lung bases. 4 mm subpleural nodule in the left lower lobe, unchanged from 03/24/2012 CT and considered benign. Hepatobiliary: 19 mm cyst in the right lobe of  the liver. Additional scattered hepatic hypodensities are incompletely characterized in the absence of IV contrast but likely cysts or hemangiomas. Gallbladder physiologically distended, no calcified stone. No biliary dilatation. Pancreas: No ductal dilatation or inflammation. Spleen: Normal in size without focal abnormality. Adrenals/Urinary Tract: Normal adrenal glands. Obstructing 5 x 6 mm stone in the left mid ureter with resultant moderate hydroureteronephrosis and mild perinephric edema. Multiple additional nonobstructing stones within the left kidney. Chronic thinning of left renal parenchyma. Multiple nonobstructing stones in the right kidney. No right hydronephrosis or hydroureter. No right ureteral calculi. Urinary bladder is physiologically distended without wall thickening or bladder stone. Stomach/Bowel: Stomach physiologically distended. No bowel wall thickening, inflammatory change or obstruction. Mild distal colonic diverticulosis without diverticulitis. Appendix not confidently visualized. Vascular/Lymphatic: Abdominal aorta is normal in caliber. Small retroperitoneal nodes likely reactive. No enlarged lymph nodes in the abdomen or pelvis. Reproductive: Uterus and bilateral adnexa are unremarkable. Other: No free air, free fluid, or intra-abdominal fluid collection. Small fat containing umbilical hernia. Musculoskeletal: There are no acute or suspicious osseous abnormalities. IMPRESSION: 1. Obstructing 6 mm stone in the left mid ureter with resultant hydronephrosis. 2. Multiple nonobstructing stones in both kidneys. Chronic left renal parenchymal thinning. 3. Distal colonic diverticulosis without diverticulitis. Electronically Signed   By: Keith Rake M.D.   On: 03/24/2018 00:05     Assessment and Plan: Left mid ureteral stone with febrile UTI.  She is doing well post stenting and could be discharged home on oral antibiotics from my standpoint.  Culture is pending.   I will arrange left  ureteroscopy for next week if possible.         LOS: 0 days    Irine Seal 03/25/2018 [phone] ID: Melinda Crutch, female   DOB: 10-05-1965, 52 y.o.   MRN: [id number]

## 2018-03-26 ENCOUNTER — Other Ambulatory Visit: Payer: Self-pay | Admitting: Urology

## 2018-03-26 LAB — URINE CULTURE: Culture: 50000 — AB

## 2018-03-26 NOTE — Anesthesia Postprocedure Evaluation (Signed)
Anesthesia Post Note  Patient: Leslie Fitzgerald  Procedure(s) Performed: CYSTOSCOPY/RETROGRADE  LEFT STENT PLACEMENT (Left Ureter)     Patient location during evaluation: PACU Anesthesia Type: General Level of consciousness: awake and alert Pain management: pain level controlled Vital Signs Assessment: post-procedure vital signs reviewed and stable Respiratory status: spontaneous breathing, nonlabored ventilation, respiratory function stable and patient connected to nasal cannula oxygen Cardiovascular status: blood pressure returned to baseline and stable Postop Assessment: no apparent nausea or vomiting Anesthetic complications: no    Last Vitals:  Vitals:   03/25/18 0136 03/25/18 0620  BP: 124/79 122/79  Pulse: 75 77  Resp: 17 17  Temp: (!) 36.4 C 36.4 C  SpO2: 98% 97%    Last Pain:  Vitals:   03/25/18 1007  TempSrc:   PainSc: 9                  Andriana Casa S

## 2018-03-27 LAB — URINE CULTURE

## 2018-03-31 ENCOUNTER — Other Ambulatory Visit: Payer: Self-pay

## 2018-03-31 ENCOUNTER — Encounter (HOSPITAL_COMMUNITY): Payer: Self-pay | Admitting: *Deleted

## 2018-04-02 NOTE — Anesthesia Preprocedure Evaluation (Addendum)
Anesthesia Evaluation  Patient identified by MRN, date of birth, ID band Patient awake    Reviewed: Allergy & Precautions, NPO status , Patient's Chart, lab work & pertinent test results  History of Anesthesia Complications (+) PONV  Airway Mallampati: II  TM Distance: >3 FB Neck ROM: Full    Dental no notable dental hx. (+) Teeth Intact, Dental Advisory Given   Pulmonary neg pulmonary ROS,    Pulmonary exam normal breath sounds clear to auscultation       Cardiovascular negative cardio ROS Normal cardiovascular exam Rhythm:Regular Rate:Normal     Neuro/Psych negative neurological ROS  negative psych ROS   GI/Hepatic negative GI ROS, Neg liver ROS,   Endo/Other  negative endocrine ROS  Renal/GU negative Renal ROS  negative genitourinary   Musculoskeletal negative musculoskeletal ROS (+)   Abdominal (+) + obese,   Peds negative pediatric ROS (+)  Hematology negative hematology ROS (+)   Anesthesia Other Findings   Reproductive/Obstetrics negative OB ROS                            Anesthesia Physical Anesthesia Plan  ASA: II  Anesthesia Plan: General   Post-op Pain Management:    Induction: Intravenous  PONV Risk Score and Plan: 4 or greater and Treatment may vary due to age or medical condition, Ondansetron, Dexamethasone and Scopolamine patch - Pre-op  Airway Management Planned: Oral ETT and LMA  Additional Equipment:   Intra-op Plan:   Post-operative Plan:   Informed Consent: I have reviewed the patients History and Physical, chart, labs and discussed the procedure including the risks, benefits and alternatives for the proposed anesthesia with the patient or authorized representative who has indicated his/her understanding and acceptance.   Dental advisory given  Plan Discussed with:   Anesthesia Plan Comments:       Anesthesia Quick Evaluation

## 2018-04-03 ENCOUNTER — Other Ambulatory Visit: Payer: Self-pay

## 2018-04-03 ENCOUNTER — Ambulatory Visit (HOSPITAL_COMMUNITY): Payer: BLUE CROSS/BLUE SHIELD | Admitting: Anesthesiology

## 2018-04-03 ENCOUNTER — Ambulatory Visit (HOSPITAL_COMMUNITY)
Admission: RE | Admit: 2018-04-03 | Discharge: 2018-04-03 | Disposition: A | Discharge status: Home / Self Care | Payer: BLUE CROSS/BLUE SHIELD | Source: Ambulatory Visit | Attending: Urology | Admitting: Urology

## 2018-04-03 ENCOUNTER — Encounter (HOSPITAL_COMMUNITY)
Admission: RE | Disposition: A | Discharge status: Home / Self Care | Payer: Self-pay | Source: Ambulatory Visit | Attending: Urology

## 2018-04-03 ENCOUNTER — Encounter (HOSPITAL_COMMUNITY): Payer: Self-pay

## 2018-04-03 ENCOUNTER — Ambulatory Visit (HOSPITAL_COMMUNITY): Payer: BLUE CROSS/BLUE SHIELD

## 2018-04-03 DIAGNOSIS — E669 Obesity, unspecified: Secondary | ICD-10-CM | POA: Diagnosis not present

## 2018-04-03 DIAGNOSIS — Z79899 Other long term (current) drug therapy: Secondary | ICD-10-CM | POA: Insufficient documentation

## 2018-04-03 DIAGNOSIS — Z6833 Body mass index (BMI) 33.0-33.9, adult: Secondary | ICD-10-CM | POA: Diagnosis not present

## 2018-04-03 DIAGNOSIS — N132 Hydronephrosis with renal and ureteral calculous obstruction: Secondary | ICD-10-CM | POA: Diagnosis not present

## 2018-04-03 HISTORY — PX: CYSTOSCOPY/URETEROSCOPY/HOLMIUM LASER/STENT PLACEMENT: SHX6546

## 2018-04-03 SURGERY — CYSTOSCOPY/URETEROSCOPY/HOLMIUM LASER/STENT PLACEMENT
Anesthesia: General | Laterality: Left

## 2018-04-03 MED ORDER — HYDROCODONE-ACETAMINOPHEN 7.5-325 MG PO TABS: 1.0000 | ORAL_TABLET | Freq: Once | ORAL | Status: DC | PRN

## 2018-04-03 MED ORDER — SCOPOLAMINE 1 MG/3DAYS TD PT72
1.0000 | MEDICATED_PATCH | TRANSDERMAL | Status: DC
  Administered 2018-04-03: 1.5 mg via TRANSDERMAL
  Filled 2018-04-03 (×2): qty 1

## 2018-04-03 MED ORDER — PROMETHAZINE HCL 25 MG/ML IJ SOLN: 6.2500 mg | INTRAMUSCULAR | Status: DC | PRN

## 2018-04-03 MED ORDER — DEXAMETHASONE SODIUM PHOSPHATE 10 MG/ML IJ SOLN
INTRAMUSCULAR | Status: DC | PRN
  Administered 2018-04-03: 10 mg via INTRAVENOUS

## 2018-04-03 MED ORDER — MEPERIDINE HCL 50 MG/ML IJ SOLN: 6.2500 mg | INTRAMUSCULAR | Status: DC | PRN

## 2018-04-03 MED ORDER — PHENYLEPHRINE 40 MCG/ML (10ML) SYRINGE FOR IV PUSH (FOR BLOOD PRESSURE SUPPORT)
PREFILLED_SYRINGE | INTRAVENOUS | Status: AC
Start: 2018-04-03 — End: ?
  Filled 2018-04-03: qty 10

## 2018-04-03 MED ORDER — PHENYLEPHRINE 40 MCG/ML (10ML) SYRINGE FOR IV PUSH (FOR BLOOD PRESSURE SUPPORT)
PREFILLED_SYRINGE | INTRAVENOUS | Status: DC | PRN
  Administered 2018-04-03: 80 ug via INTRAVENOUS

## 2018-04-03 MED ORDER — AMPICILLIN 500 MG PO CAPS: 500.0000 mg | ORAL_CAPSULE | Freq: Four times a day (QID) | ORAL | 0 refills | Status: DC

## 2018-04-03 MED ORDER — ONDANSETRON HCL 4 MG/2ML IJ SOLN
INTRAMUSCULAR | Status: DC | PRN
  Administered 2018-04-03: 4 mg via INTRAVENOUS

## 2018-04-03 MED ORDER — HYDROMORPHONE HCL 1 MG/ML IJ SOLN
INTRAMUSCULAR | Status: AC
  Filled 2018-04-03: qty 1

## 2018-04-03 MED ORDER — MIDAZOLAM HCL 2 MG/2ML IJ SOLN
INTRAMUSCULAR | Status: AC
  Filled 2018-04-03: qty 2

## 2018-04-03 MED ORDER — TRAMADOL HCL 50 MG PO TABS: 50.0000 mg | ORAL_TABLET | Freq: Four times a day (QID) | ORAL | 0 refills | Status: AC | PRN

## 2018-04-03 MED ORDER — MIDAZOLAM HCL 5 MG/5ML IJ SOLN
INTRAMUSCULAR | Status: DC | PRN
  Administered 2018-04-03: 2 mg via INTRAVENOUS

## 2018-04-03 MED ORDER — ONDANSETRON HCL 4 MG/2ML IJ SOLN
INTRAMUSCULAR | Status: AC
  Filled 2018-04-03: qty 2

## 2018-04-03 MED ORDER — LACTATED RINGERS IV SOLN
INTRAVENOUS | Status: DC
  Administered 2018-04-03: 08:00:00 via INTRAVENOUS

## 2018-04-03 MED ORDER — LIDOCAINE 2% (20 MG/ML) 5 ML SYRINGE
INTRAMUSCULAR | Status: AC
  Filled 2018-04-03: qty 10

## 2018-04-03 MED ORDER — FENTANYL CITRATE (PF) 100 MCG/2ML IJ SOLN
INTRAMUSCULAR | Status: DC | PRN
  Administered 2018-04-03: 50 ug via INTRAVENOUS

## 2018-04-03 MED ORDER — PROPOFOL 10 MG/ML IV BOLUS
INTRAVENOUS | Status: DC | PRN
  Administered 2018-04-03: 160 mg via INTRAVENOUS

## 2018-04-03 MED ORDER — LEVOFLOXACIN IN D5W 500 MG/100ML IV SOLN
500.0000 mg | Freq: Once | INTRAVENOUS | Status: AC
  Administered 2018-04-03: 500 mg via INTRAVENOUS
  Filled 2018-04-03: qty 100

## 2018-04-03 MED ORDER — PROPOFOL 10 MG/ML IV BOLUS
INTRAVENOUS | Status: AC
  Filled 2018-04-03: qty 20

## 2018-04-03 MED ORDER — HYDROMORPHONE HCL 1 MG/ML IJ SOLN
0.2500 mg | INTRAMUSCULAR | Status: DC | PRN
  Administered 2018-04-03 (×4): 0.5 mg via INTRAVENOUS

## 2018-04-03 MED ORDER — FENTANYL CITRATE (PF) 100 MCG/2ML IJ SOLN
INTRAMUSCULAR | Status: AC
  Filled 2018-04-03: qty 2

## 2018-04-03 MED ORDER — ACETAMINOPHEN 10 MG/ML IV SOLN: 1000.0000 mg | Freq: Once | INTRAVENOUS | Status: DC | PRN

## 2018-04-03 MED ORDER — DEXAMETHASONE SODIUM PHOSPHATE 10 MG/ML IJ SOLN
INTRAMUSCULAR | Status: AC
Start: 2018-04-03 — End: ?
  Filled 2018-04-03: qty 1

## 2018-04-03 MED ORDER — LIDOCAINE 2% (20 MG/ML) 5 ML SYRINGE
INTRAMUSCULAR | Status: DC | PRN
  Administered 2018-04-03: 100 mg via INTRAVENOUS

## 2018-04-03 MED ORDER — SODIUM CHLORIDE 0.9 % IV SOLN
2.0000 g | INTRAVENOUS | Status: AC
  Administered 2018-04-03: 2 g via INTRAVENOUS
  Filled 2018-04-03: qty 20

## 2018-04-03 MED ORDER — SODIUM CHLORIDE 0.9 % IR SOLN
Status: DC | PRN
  Administered 2018-04-03: 3000 mL

## 2018-04-03 SURGICAL SUPPLY — 21 items
BAG URO CATCHER STRL LF (MISCELLANEOUS) ×2 IMPLANT
BASKET STONE NCOMPASS (UROLOGICAL SUPPLIES) IMPLANT
CATH URET 5FR 28IN OPEN ENDED (CATHETERS) ×2 IMPLANT
CATH URET DUAL LUMEN 6-10FR 50 (CATHETERS) IMPLANT
CLOTH BEACON ORANGE TIMEOUT ST (SAFETY) ×2 IMPLANT
EXTRACTOR STONE NITINOL NGAGE (UROLOGICAL SUPPLIES) ×2 IMPLANT
FIBER LASER FLEXIVA 1000 (UROLOGICAL SUPPLIES) IMPLANT
FIBER LASER FLEXIVA 365 (UROLOGICAL SUPPLIES) ×2 IMPLANT
FIBER LASER FLEXIVA 550 (UROLOGICAL SUPPLIES) IMPLANT
FIBER LASER TRAC TIP (UROLOGICAL SUPPLIES) IMPLANT
GLOVE SURG SS PI 8.0 STRL IVOR (GLOVE) ×2 IMPLANT
GOWN STRL REUS W/TWL XL LVL3 (GOWN DISPOSABLE) ×2 IMPLANT
GUIDEWIRE STR DUAL SENSOR (WIRE) ×2 IMPLANT
IV NS 1000ML (IV SOLUTION)
IV NS 1000ML BAXH (IV SOLUTION) IMPLANT
IV NS IRRIG 3000ML ARTHROMATIC (IV SOLUTION) IMPLANT
MANIFOLD NEPTUNE II (INSTRUMENTS) ×2 IMPLANT
PACK CYSTO (CUSTOM PROCEDURE TRAY) ×2 IMPLANT
SHEATH URETERAL 12FRX35CM (MISCELLANEOUS) IMPLANT
TUBING CONNECTING 10 (TUBING) ×2 IMPLANT
TUBING UROLOGY SET (TUBING) ×2 IMPLANT

## 2018-04-03 NOTE — Op Note (Signed)
Procedure: 1.  Cystoscopy with removal of left double-J stent. 2.  Left ureteroscopy with holmium application and stone removal.  Preop diagnosis: 6 mm left mid ureteral stone.  Postop diagnosis: Same.  Surgeon: Dr. Irine Seal.  Anesthesia: General.  Drain: None.  Specimen: Stone fragments.  EBL: None.  Complications: None.  Indications: Leslie Fitzgerald is a 52 year old white female who was seen in the hospital for a left mid ureteral obstructing stone with a urinary tract infection and underwent stenting on 03/24/2018.  She was found to have an enterococcal UTI was treated with ampicillin.  She returns now for stone removal.  Procedure: She was taken operating room where she was given Rocephin and Levaquin.  A general anesthetic was induced.  She was placed in lithotomy position and fitted with PAS hose.  Her perineum and genitalia were prepped with Betadine solution she was draped in usual sterile fashion.  Cystoscopy was performed using a 7 [place] scope with a 30 degree lens.  Inspection revealed a stent at the left ureteral orifice.  The stent is minor encrustation and there was some periureteral edema.  No other bladder wall abnormalities were noted.  The stent was grasped with a grasping forceps and pulled the urethral meatus.  A guidewire was passed to the kidney under fluoroscopic guidance through the stent and the stent was removed.  The 6.5 French semirigid ureteroscope was then inserted alongside the wire and the stone was identified at the upper margin of the mid ureter.  The stone was too large to remove intact.  27 m laser fiber was then inserted with the initial settings of 0.2 J and 20 Hz with an increased to 0.5 J after initial efforts did not produce desired fragmentation.  The stone was then broken into manageable fragments which were removed with an engage basket.  Final ureteroscopic and fluoroscopic inspection revealed no significant residual fragments, only some minor dust if  there is stone fragmentation.  There were mild inflammatory changes at the site of the stone impaction but no ureteral obstruction or ureteral injury and it was not felt that replacement of the stent was indicated.  Ureteroscope was removed and the cystoscope was replaced to drain the bladder.  Patient was taken down from the lithotomy position, her anesthetic was reversed and she was moved to recovery in stable condition.  There were no complications.

## 2018-04-03 NOTE — Transfer of Care (Signed)
Immediate Anesthesia Transfer of Care Note  Patient: Leslie Fitzgerald  Procedure(s) Performed: CYSTOSCOPY/STENT REMOVAL/LEFT URETEROSCOPY/HOLMIUM LASER/BASKETING OF STONE (Left )  Patient Location: PACU  Anesthesia Type:General  Level of Consciousness: sedated  Airway & Oxygen Therapy: Patient Spontanous Breathing and Patient connected to face mask oxygen  Post-op Assessment: Report given to RN and Post -op Vital signs reviewed and stable  Post vital signs: Reviewed and stable  Last Vitals:  Vitals Value Taken Time  BP    Temp    Pulse    Resp    SpO2      Last Pain:  Vitals:   04/03/18 0737  TempSrc:   PainSc: 0-No pain         Complications: No apparent anesthesia complications

## 2018-04-03 NOTE — Anesthesia Postprocedure Evaluation (Signed)
Anesthesia Post Note  Patient: Leslie Fitzgerald  Procedure(s) Performed: CYSTOSCOPY/STENT REMOVAL/LEFT URETEROSCOPY/HOLMIUM LASER/BASKETING OF STONE (Left )     Patient location during evaluation: PACU Anesthesia Type: General Level of consciousness: awake and alert Pain management: pain level controlled Vital Signs Assessment: post-procedure vital signs reviewed and stable Respiratory status: spontaneous breathing, nonlabored ventilation, respiratory function stable and patient connected to nasal cannula oxygen Cardiovascular status: blood pressure returned to baseline and stable Postop Assessment: no apparent nausea or vomiting Anesthetic complications: no    Last Vitals:  Vitals:   04/03/18 0643 04/03/18 0937  BP: 110/77 (P) 114/88  Pulse: 78 (P) 83  Resp: 16 (P) 15  Temp: 36.9 C (P) 36.8 C  SpO2: 100% (P) 100%    Last Pain:  Vitals:   04/03/18 0737  TempSrc:   PainSc: 0-No pain                 Barnet Glasgow

## 2018-04-03 NOTE — OR Nursing (Signed)
Stone taken by Dr. Wrenn 

## 2018-04-03 NOTE — Anesthesia Procedure Notes (Signed)
Procedure Name: LMA Insertion Date/Time: 04/03/2018 9:02 AM Performed by: Lind Covert, CRNA Pre-anesthesia Checklist: Patient identified, Emergency Drugs available, Suction available, Patient being monitored and Timeout performed Patient Re-evaluated:Patient Re-evaluated prior to induction Oxygen Delivery Method: Circle system utilized Preoxygenation: Pre-oxygenation with 100% oxygen Induction Type: IV induction LMA: LMA inserted LMA Size: 4.0 Tube type: Oral Number of attempts: 1 Placement Confirmation: positive ETCO2 and breath sounds checked- equal and bilateral Tube secured with: Tape Dental Injury: Teeth and Oropharynx as per pre-operative assessment

## 2018-04-03 NOTE — Discharge Instructions (Signed)
CYSTOSCOPY HOME CARE INSTRUCTIONS  Activity: Rest for the remainder of the day.  Do not drive or operate equipment today.  You may resume normal activities in one to two days as instructed by your physician.   Meals: Drink plenty of liquids and eat light foods such as gelatin or soup this evening.  You may return to a normal meal plan tomorrow.  Return to Work: You may return to work in one to two days or as instructed by your physician.  Special Instructions / Symptoms: Call your physician if any of these symptoms occur:   -persistent or heavy bleeding  -bleeding which continues after first few urination  -large blood clots that are difficult to pass  -urine stream diminishes or stops completely  -fever equal to or higher than 101 degrees Farenheit.  -cloudy urine with a strong, foul odor  -severe pain  Females should always wipe from front to back after elimination.  You may feel some burning pain when you urinate.  This should disappear with time.  Applying moist heat to the lower abdomen or a hot tub bath may help relieve the pain. \  Please bring stone fragments to the office.   Patient Signature:  ________________________________________________________  Nurse's Signature:  ________________________________________________________

## 2018-04-03 NOTE — Interval H&P Note (Signed)
History and Physical Interval Note:  She has been treated for her UTI.     04/03/2018 8:47 AM  Leslie Fitzgerald  has presented today for surgery, with the diagnosis of LEFT MID URETERAL STONE  The various methods of treatment have been discussed with the patient and family. After consideration of risks, benefits and other options for treatment, the patient has consented to  Procedure(s): LEFT URETEROSCOPY/HOLMIUM LASER/STENT PLACEMENT (Left) as a surgical intervention .  The patient's history has been reviewed, patient examined, no change in status, stable for surgery.  I have reviewed the patient's chart and labs.  Questions were answered to the patient's satisfaction.     Irine Seal

## 2018-04-04 ENCOUNTER — Encounter (HOSPITAL_COMMUNITY): Payer: Self-pay | Admitting: Urology

## 2021-08-22 ENCOUNTER — Other Ambulatory Visit: Payer: Self-pay | Admitting: Urology

## 2021-08-24 NOTE — Progress Notes (Signed)
Please place orders for PAT appointment scheduled 08/25/21

## 2021-08-24 NOTE — Patient Instructions (Addendum)
DUE TO COVID-19 ONLY ONE VISITOR IS ALLOWED TO COME WITH YOU AND STAY IN THE WAITING ROOM ONLY DURING PRE OP AND PROCEDURE.   **NO VISITORS ARE ALLOWED IN THE SHORT STAY AREA OR RECOVERY ROOM!!**       Your procedure is scheduled on: 08/28/21   Report to [facility] Main Entrance    Report to admitting at 10:45 AM   Call this number if you have problems the morning of surgery [phone]   Follow a clear liquid diet the day before surgery   May have liquids until 10:00 AM day of surgery  CLEAR LIQUID DIET  Foods Allowed                                                                     Foods Excluded  Water, Black Coffee and tea, regular and decaf                             liquids that you cannot  Plain Jell-O in any flavor  (No red)                                           see through such as: Fruit ices (not with fruit pulp)                                     milk, soups, orange juice              Iced Popsicles (No red)                                    All solid food                                   Apple juices Sports drinks like Gatorade (No red) Lightly seasoned clear broth or consume(fat free) Sugar  Sample Menu Breakfast                                Lunch                                     Supper Cranberry juice                    Beef broth                            Chicken broth Jell-O                                     Grape juice  Apple juice Coffee or tea                        Jell-O                                      Popsicle                                                Coffee or tea                        Coffee or tea    FOLLOW BOWEL PREP AND ANY ADDITIONAL PRE OP INSTRUCTIONS YOU RECEIVED FROM YOUR SURGEON'S OFFICE!!!     Oral Hygiene is also important to reduce your risk of infection.                                    Remember - BRUSH YOUR TEETH THE MORNING OF SURGERY WITH YOUR REGULAR TOOTHPASTE   Stop  all vitamins and supplements. Follow instructions you were given regarding  Aspirin    Take these medicines the morning of surgery with A SIP OF WATER: Buspirone, Citalopram, Percocet                               You may not have any metal on your body including hair pins, jewelry, and body piercing             Do not wear make-up, lotions, powders, perfumes, or deodorant  Do not wear nail polish including gel and S&S, artificial/acrylic nails, or any other type of covering on natural nails including finger and toenails. If you have artificial nails, gel coating, etc. that needs to be removed by a nail salon please have this removed prior to surgery or surgery may need to be canceled/ delayed if the surgeon/ anesthesia feels like they are unable to be safely monitored.   Do not shave  48 hours prior to surgery.    Do not bring valuables to the hospital. [place].    Patients discharged on the day of surgery will not be allowed to drive home.  Someone needs to stay with you for the first 24 hours after anesthesia.              Please read over the following fact sheets you were given: IF YOU HAVE QUESTIONS ABOUT YOUR PRE-OP INSTRUCTIONS PLEASE CALL [place] - Preparing for Surgery Before surgery, you can play an important role.  Because skin is not sterile, your skin needs to be as free of germs as possible.  You can reduce the number of germs on your skin by washing with CHG (chlorahexidine gluconate) soap before surgery.  CHG is an antiseptic cleaner which kills germs and bonds with the skin to continue killing germs even after washing. Please DO NOT use if you have an allergy to CHG or antibacterial soaps.  If your skin becomes reddened/irritated stop  using the CHG and inform your nurse when you arrive at Short Stay. Do not shave (including legs and underarms) for at least 48 hours prior to the first CHG shower.   You may shave your face/neck.  Please follow these instructions carefully:  1.  Shower with CHG Soap the night before surgery and the  morning of surgery.  2.  If you choose to wash your hair, wash your hair first as usual with your normal  shampoo.  3.  After you shampoo, rinse your hair and body thoroughly to remove the shampoo.                             4.  Use CHG as you would any other liquid soap.  You can apply chg directly to the skin and wash.  Gently with a scrungie or clean washcloth.  5.  Apply the CHG Soap to your body ONLY FROM THE NECK DOWN.   Do   not use on face/ open                           Wound or open sores. Avoid contact with eyes, ears mouth and   genitals (private parts).                       Wash face,  Genitals (private parts) with your normal soap.             6.  Wash thoroughly, paying special attention to the area where your    surgery  will be performed.  7.  Thoroughly rinse your body with warm water from the neck down.  8.  DO NOT shower/wash with your normal soap after using and rinsing off the CHG Soap.                9.  Pat yourself dry with a clean towel.            10.  Wear clean pajamas.            11.  Place clean sheets on your bed the night of your first shower and do not  sleep with pets. Day of Surgery : Do not apply any lotions/deodorants the morning of surgery.  Please wear clean clothes to the hospital/surgery center.  FAILURE TO FOLLOW THESE INSTRUCTIONS MAY RESULT IN THE CANCELLATION OF YOUR SURGERY  PATIENT SIGNATURE_________________________________  NURSE SIGNATURE__________________________________  ________________________________________________________________________

## 2021-08-24 NOTE — Progress Notes (Addendum)
COVID swab appointment: n/a  COVID Vaccine Completed: yes x2 Date COVID Vaccine completed: Has received booster: COVID vaccine manufacturer: [place]   Date of COVID positive in last 73 days:no  PCP - Adriana Reams, PA Cardiologist -   Chest x-ray - n/a EKG -  n/a Stress Test - n/a ECHO - n/a Cardiac Cath - n/a Pacemaker/ICD device last checked: n/a Spinal Cord Stimulator:n/a  Bowel Prep - clears  days before  Sleep Study - n/a CPAP -   Fasting Blood Sugar - n/a Checks Blood Sugar _____ times a day  Blood Thinner Instructions:  Aspirin Instructions: ASA 81, on hold for surgery Last Dose:  Activity level: Can go up a flight of stairs and perform activities of daily living without stopping and without symptoms of chest pain or shortness of breath.      Anesthesia review:   Patient denies shortness of breath, fever, cough and chest pain at PAT appointment   Patient verbalized understanding of instructions that were given to them at the PAT appointment. Patient was also instructed that they will need to review over the PAT instructions again at home before surgery.

## 2021-08-25 ENCOUNTER — Encounter (HOSPITAL_COMMUNITY)
Admission: RE | Admit: 2021-08-25 | Discharge: 2021-08-25 | Disposition: A | Discharge status: Home / Self Care | Payer: BC Managed Care – PPO | Source: Ambulatory Visit | Attending: Urology | Admitting: Urology

## 2021-08-25 ENCOUNTER — Other Ambulatory Visit: Payer: Self-pay

## 2021-08-25 ENCOUNTER — Encounter (HOSPITAL_COMMUNITY): Payer: Self-pay

## 2021-08-25 VITALS — BP 109/71 | HR 84 | Temp 98.6°F | Resp 12 | Ht 62.0 in | Wt 190.6 lb

## 2021-08-25 DIAGNOSIS — Z01818 Encounter for other preprocedural examination: Secondary | ICD-10-CM

## 2021-08-25 DIAGNOSIS — Z01812 Encounter for preprocedural laboratory examination: Secondary | ICD-10-CM | POA: Diagnosis present

## 2021-08-25 HISTORY — DX: Headache, unspecified: R51.9

## 2021-08-25 LAB — CBC
HCT: 45.6 % (ref 36.0–46.0)
Hemoglobin: 15.2 g/dL — ABNORMAL HIGH (ref 12.0–15.0)
MCH: 30.9 pg (ref 26.0–34.0)
MCHC: 33.3 g/dL (ref 30.0–36.0)
MCV: 92.7 fL (ref 80.0–100.0)
Platelets: 201 10*3/uL (ref 150–400)
RBC: 4.92 MIL/uL (ref 3.87–5.11)
RDW: 12.5 % (ref 11.5–15.5)
WBC: 5 10*3/uL (ref 4.0–10.5)
nRBC: 0 % (ref 0.0–0.2)

## 2021-08-28 ENCOUNTER — Ambulatory Visit (HOSPITAL_COMMUNITY)
Admission: RE | Admit: 2021-08-28 | Discharge: 2021-08-28 | Disposition: A | Discharge status: Home / Self Care | Payer: BC Managed Care – PPO | Source: Home / Self Care | Attending: Urology | Admitting: Urology

## 2021-08-28 ENCOUNTER — Encounter (HOSPITAL_COMMUNITY): Payer: Self-pay | Admitting: Urology

## 2021-08-28 ENCOUNTER — Ambulatory Visit (HOSPITAL_COMMUNITY): Payer: BC Managed Care – PPO | Admitting: Physician Assistant

## 2021-08-28 ENCOUNTER — Ambulatory Visit (HOSPITAL_COMMUNITY): Payer: BC Managed Care – PPO

## 2021-08-28 ENCOUNTER — Encounter (HOSPITAL_COMMUNITY)
Admission: RE | Disposition: A | Discharge status: Home / Self Care | Payer: Self-pay | Source: Home / Self Care | Attending: Urology

## 2021-08-28 ENCOUNTER — Ambulatory Visit (HOSPITAL_COMMUNITY): Payer: BC Managed Care – PPO | Admitting: Certified Registered Nurse Anesthetist

## 2021-08-28 DIAGNOSIS — Z87442 Personal history of urinary calculi: Secondary | ICD-10-CM | POA: Insufficient documentation

## 2021-08-28 DIAGNOSIS — A419 Sepsis, unspecified organism: Secondary | ICD-10-CM | POA: Diagnosis not present

## 2021-08-28 DIAGNOSIS — R509 Fever, unspecified: Secondary | ICD-10-CM | POA: Diagnosis not present

## 2021-08-28 DIAGNOSIS — Z01818 Encounter for other preprocedural examination: Secondary | ICD-10-CM

## 2021-08-28 DIAGNOSIS — N202 Calculus of kidney with calculus of ureter: Secondary | ICD-10-CM | POA: Insufficient documentation

## 2021-08-28 DIAGNOSIS — N132 Hydronephrosis with renal and ureteral calculous obstruction: Secondary | ICD-10-CM

## 2021-08-28 HISTORY — PX: CYSTOSCOPY/URETEROSCOPY/HOLMIUM LASER/STENT PLACEMENT: SHX6546

## 2021-08-28 SURGERY — CYSTOSCOPY/URETEROSCOPY/HOLMIUM LASER/STENT PLACEMENT
Anesthesia: General | Site: Renal | Laterality: Bilateral

## 2021-08-28 MED ORDER — IOHEXOL 300 MG/ML  SOLN
INTRAMUSCULAR | Status: DC | PRN
  Administered 2021-08-28: 5 mL

## 2021-08-28 MED ORDER — ORAL CARE MOUTH RINSE: 15.0000 mL | Freq: Once | OROMUCOSAL | Status: AC

## 2021-08-28 MED ORDER — DOCUSATE SODIUM 100 MG PO CAPS: 100.0000 mg | ORAL_CAPSULE | Freq: Every day | ORAL | 0 refills | Status: AC | PRN

## 2021-08-28 MED ORDER — DEXAMETHASONE SODIUM PHOSPHATE 10 MG/ML IJ SOLN
INTRAMUSCULAR | Status: AC
  Filled 2021-08-28: qty 1

## 2021-08-28 MED ORDER — MIDAZOLAM HCL 2 MG/2ML IJ SOLN
INTRAMUSCULAR | Status: AC
  Filled 2021-08-28: qty 2

## 2021-08-28 MED ORDER — AMISULPRIDE (ANTIEMETIC) 5 MG/2ML IV SOLN: 10.0000 mg | Freq: Once | INTRAVENOUS | Status: DC | PRN

## 2021-08-28 MED ORDER — SODIUM CHLORIDE 0.9 % IR SOLN
Status: DC | PRN
  Administered 2021-08-28: 3000 mL

## 2021-08-28 MED ORDER — ONDANSETRON HCL 4 MG/2ML IJ SOLN
INTRAMUSCULAR | Status: AC
  Filled 2021-08-28: qty 2

## 2021-08-28 MED ORDER — CEPHALEXIN 500 MG PO CAPS: 500.0000 mg | ORAL_CAPSULE | Freq: Two times a day (BID) | ORAL | 0 refills | Status: DC

## 2021-08-28 MED ORDER — ACETAMINOPHEN 500 MG PO TABS
ORAL_TABLET | ORAL | Status: AC
  Administered 2021-08-28: 1000 mg via ORAL
  Filled 2021-08-28: qty 2

## 2021-08-28 MED ORDER — FENTANYL CITRATE PF 50 MCG/ML IJ SOSY
PREFILLED_SYRINGE | INTRAMUSCULAR | Status: AC
  Filled 2021-08-28: qty 2

## 2021-08-28 MED ORDER — PROPOFOL 10 MG/ML IV BOLUS
INTRAVENOUS | Status: DC | PRN
  Administered 2021-08-28: 200 mg via INTRAVENOUS

## 2021-08-28 MED ORDER — FENTANYL CITRATE (PF) 100 MCG/2ML IJ SOLN
INTRAMUSCULAR | Status: AC
  Filled 2021-08-28: qty 2

## 2021-08-28 MED ORDER — OXYCODONE HCL 5 MG PO TABS: 5.0000 mg | ORAL_TABLET | Freq: Once | ORAL | Status: DC | PRN

## 2021-08-28 MED ORDER — LIDOCAINE HCL (PF) 2 % IJ SOLN
INTRAMUSCULAR | Status: AC
  Filled 2021-08-28: qty 5

## 2021-08-28 MED ORDER — PROMETHAZINE HCL 25 MG/ML IJ SOLN: 6.2500 mg | INTRAMUSCULAR | Status: DC | PRN

## 2021-08-28 MED ORDER — FENTANYL CITRATE (PF) 100 MCG/2ML IJ SOLN
INTRAMUSCULAR | Status: DC | PRN
  Administered 2021-08-28 (×2): 50 ug via INTRAVENOUS

## 2021-08-28 MED ORDER — CEFAZOLIN SODIUM-DEXTROSE 2-4 GM/100ML-% IV SOLN
2.0000 g | Freq: Once | INTRAVENOUS | Status: AC
  Administered 2021-08-28: 2 g via INTRAVENOUS
  Filled 2021-08-28: qty 100

## 2021-08-28 MED ORDER — KETOROLAC TROMETHAMINE 30 MG/ML IJ SOLN
30.0000 mg | Freq: Once | INTRAMUSCULAR | Status: AC | PRN
  Administered 2021-08-28: 30 mg via INTRAVENOUS

## 2021-08-28 MED ORDER — CHLORHEXIDINE GLUCONATE 0.12 % MT SOLN
15.0000 mL | Freq: Once | OROMUCOSAL | Status: AC
  Administered 2021-08-28: 15 mL via OROMUCOSAL

## 2021-08-28 MED ORDER — PROPOFOL 10 MG/ML IV BOLUS
INTRAVENOUS | Status: AC
  Filled 2021-08-28: qty 20

## 2021-08-28 MED ORDER — PHENYLEPHRINE 40 MCG/ML (10ML) SYRINGE FOR IV PUSH (FOR BLOOD PRESSURE SUPPORT)
PREFILLED_SYRINGE | INTRAVENOUS | Status: DC | PRN
  Administered 2021-08-28: 120 ug via INTRAVENOUS

## 2021-08-28 MED ORDER — EPHEDRINE SULFATE-NACL 50-0.9 MG/10ML-% IV SOSY
PREFILLED_SYRINGE | INTRAVENOUS | Status: DC | PRN
  Administered 2021-08-28 (×2): 5 mg via INTRAVENOUS

## 2021-08-28 MED ORDER — ONDANSETRON HCL 4 MG/2ML IJ SOLN
INTRAMUSCULAR | Status: DC | PRN
  Administered 2021-08-28: 4 mg via INTRAVENOUS

## 2021-08-28 MED ORDER — LIDOCAINE 2% (20 MG/ML) 5 ML SYRINGE
INTRAMUSCULAR | Status: DC | PRN
  Administered 2021-08-28: 60 mg via INTRAVENOUS

## 2021-08-28 MED ORDER — OXYCODONE-ACETAMINOPHEN 5-325 MG PO TABS: 1.0000 | ORAL_TABLET | Freq: Four times a day (QID) | ORAL | 0 refills | Status: DC | PRN

## 2021-08-28 MED ORDER — MIDAZOLAM HCL 5 MG/5ML IJ SOLN
INTRAMUSCULAR | Status: DC | PRN
  Administered 2021-08-28: 2 mg via INTRAVENOUS

## 2021-08-28 MED ORDER — LACTATED RINGERS IV SOLN: INTRAVENOUS | Status: DC

## 2021-08-28 MED ORDER — DEXAMETHASONE SODIUM PHOSPHATE 4 MG/ML IJ SOLN
INTRAMUSCULAR | Status: DC | PRN
  Administered 2021-08-28: 8 mg via INTRAVENOUS

## 2021-08-28 MED ORDER — FENTANYL CITRATE PF 50 MCG/ML IJ SOSY
25.0000 ug | PREFILLED_SYRINGE | INTRAMUSCULAR | Status: DC | PRN
  Administered 2021-08-28: 50 ug via INTRAVENOUS

## 2021-08-28 MED ORDER — ACETAMINOPHEN 500 MG PO TABS
1000.0000 mg | ORAL_TABLET | Freq: Once | ORAL | Status: AC
Start: 2021-08-28 — End: 2021-08-28

## 2021-08-28 MED ORDER — OXYCODONE HCL 5 MG/5ML PO SOLN: 5.0000 mg | Freq: Once | ORAL | Status: DC | PRN

## 2021-08-28 MED ORDER — KETOROLAC TROMETHAMINE 30 MG/ML IJ SOLN
INTRAMUSCULAR | Status: AC
  Filled 2021-08-28: qty 1

## 2021-08-28 SURGICAL SUPPLY — 25 items
APL SKNCLS STERI-STRIP NONHPOA (GAUZE/BANDAGES/DRESSINGS) ×1
BAG URO CATCHER STRL LF (MISCELLANEOUS) ×2 IMPLANT
BASKET ZERO TIP NITINOL 2.4FR (BASKET) ×1 IMPLANT
BENZOIN TINCTURE PRP APPL 2/3 (GAUZE/BANDAGES/DRESSINGS) ×1 IMPLANT
BSKT STON RTRVL ZERO TP 2.4FR (BASKET) ×1
CATH URETL OPEN 5X70 (CATHETERS) ×2 IMPLANT
CLOTH BEACON ORANGE TIMEOUT ST (SAFETY) ×2 IMPLANT
DRSG TEGADERM 2-3/8X2-3/4 SM (GAUZE/BANDAGES/DRESSINGS) ×1 IMPLANT
FIBER LASER MOSES 200 DFL (Laser) IMPLANT
GLOVE SURG ENC TEXT LTX SZ7 (GLOVE) ×2 IMPLANT
GLOVE SURG UNDER POLY LF SZ7 (GLOVE) ×2 IMPLANT
GOWN STRL REUS W/TWL LRG LVL3 (GOWN DISPOSABLE) ×4 IMPLANT
GUIDEWIRE STR DUAL SENSOR (WIRE) ×6 IMPLANT
GUIDEWIRE ZIPWRE .038 STRAIGHT (WIRE) IMPLANT
KIT TURNOVER KIT A (KITS) IMPLANT
LASER FIB FLEXIVA PULSE ID 365 (Laser) IMPLANT
MANIFOLD NEPTUNE II (INSTRUMENTS) ×2 IMPLANT
PACK CYSTO (CUSTOM PROCEDURE TRAY) ×2 IMPLANT
SHEATH URETERAL 12FR 45CM (SHEATH) IMPLANT
SHEATH URETERAL 12FRX35CM (MISCELLANEOUS) ×1 IMPLANT
STENT URET 6FRX24 CONTOUR (STENTS) ×2 IMPLANT
TRACTIP FLEXIVA PULS ID 200XHI (Laser) IMPLANT
TRACTIP FLEXIVA PULSE ID 200 (Laser) ×2
TUBING CONNECTING 10 (TUBING) ×2 IMPLANT
TUBING UROLOGY SET (TUBING) ×2 IMPLANT

## 2021-08-28 NOTE — Op Note (Signed)
Operative Note  Preoperative diagnosis:  1.  Left ureteral stone 2. Right renal stone   Postoperative diagnosis: 1.  Left ureteral stone 2. Right renal stone  Procedure(s): 1.  Cystoscopy 2.  Bilateral ureteroscopy with laser lithotripsy and basket extraction of stones 3.  Bilateral retrograde pyelogram 4.  Bilateral ureteral stent placement 5. Fluoroscopy with intraoperative interpretation  Surgeon: Rexene Alberts, MD  Assistants:  None  Anesthesia:  General  Complications:  None  EBL:  Minimal  Specimens: 1. Stones for stone analysis (to be done at [facility])  Drains/Catheters: 1.  Left 6Fr x 24cm ureteral stent WITH tether string 2. Right 6Fr x 24cm ureteral stent WITH tether string  Intraoperative findings:   Cystoscopy demonstrated no suspicious bladder lesions. Left ureteroscopy demonstrated approximately 7 mm distal left ureteral stone.  This was fragmented and basket extracted.  Right ureteroscopy demonstrated 5 mm right interpolar stone.  This was dusted and no large stone fragments remained. Bilateral retrograde pyelogram demonstrated moderate bilateral hydronephrosis.  There is no extravasation of contrast. Successful bilateral ureteral stent placement.  Indication:  Leslie Fitzgerald is a 56 y.o. female with a history of urolithiasis.  She does have a smaller left kidney likely due to previous obstruction and previous urologic procedures for urolithiasis.  CT A/P 08/08/2021 demonstrated 7 mm distal left ureteral stone with no hydronephrosis and approximately 59mm right lower pole stone.  She is being brought back to the operating today for definitive treatment of her stones.  Description of procedure: After informed consent was obtained from the patient, the patient was identified and taken to the operating room and placed in the supine position.  General anesthesia was administered as well as perioperative IV antibiotics.  At the beginning of the case, a  time-out was performed to properly identify the patient, the surgery to be performed, and the surgical site.  Sequential compression devices were applied to the lower extremities at the beginning of the case for DVT prophylaxis.  The patient was then placed in the dorsal lithotomy supine position, prepped and draped in sterile fashion.  Preliminary scout fluoroscopy revealed that there was a 7 mm calcification area at the distal left ureter, which corresponds to the stone found on the preoperative CT scan. We then passed the 21-French rigid cystoscope through the urethra and into the bladder under vision without any difficulty, noting a normal urethra.  A systematic evaluation of the bladder revealed no evidence of any suspicious bladder lesions.  Ureteral orifices were in normal position.    Under cystoscopic and flouroscopic guidance, we cannulated the left ureteral orifice with a 5-French open-ended ureteral catheter and a gentle retrograde pyelogram was performed, revealing a normal caliber ureter without any filling defects. There was no hydronephrosis of the collecting system.  A 0.038 sensor wire was then passed up to the level of the renal pelvis and secured to the drape as a safety wire. The ureteral catheter and cystoscope were removed, leaving the safety wire in place.   Under cystoscopic and flouroscopic guidance, we cannulated the right ureteral orifice with a 5-French open-ended ureteral catheter and a gentle retrograde pyelogram was performed, revealing a normal caliber ureter without any filling defects. There was no hydronephrosis of the collecting system.  A 0.038 sensor wire was then passed up to the level of the renal pelvis and secured to the drape as a safety wire. The ureteral catheter and cystoscope were removed, leaving the safety wire in place.   A semi-rigid ureteroscope  was passed alongside the wire up the distal left ureter which appeared normal.  I encountered approximately 7 mm  stone.  Using the 200 m holmium laser fiber, the stone was fragmented completely.  A 0 tip basket was used to extract the stone fragments.  I then passed this semirigid scope all the way to the proximal ureter and encountered no further stones.  I attempted to pass a flexible ureteroscope over this previously placed wire however it would not advance beyond the distal left ureter.  I then dilated using a 12/14 [place] inner sheath of an access sheath however this would not accommodate the ureteroscope.  Using a complete 14 sheath was unable to advance beyond the distal left ureter as this was buckling within the bladder.  I reviewed her CT scan and just noted punctate left renal stones.  Given no significant large fragments in her left kidney, I decided not to dilate aggressively with the balloon dilator any further.  At the end the case, I did pass a stent attached to a string draining her left kidney with excellent urine return.    A semi-rigid ureteroscope was passed alongside the wire up the distal right ureter which appeared normal. A second 0.038 sensor wire was passed under direct vision and the semirigid scope was removed.  The inner sheath of the 12/14 French access sheath was used to dilate the distal right ureter.  I then navigated the flexible ureteroscope over the wire and into the collecting system where encountered 5 mm right interpolar stone.  Using 200 m holmium laser fiber, the stone was dusted completely.  There were no larger fragments.  All the fragments were less than the size of the tip of the fiber.  With the ureteroscope in the kidney, a gentle pyelogram was performed to delineate the calyceal system and we evaluated the calyces systematically. We encountered a no further large stone fragments. The rest of the stone fragments were very tiny and these were  irrigated away gently. The calyces were re-inspected and there were no significant stone fragment residual.   We then withdrew the  ureteroscope back down the ureter, noting no evidence of any stones along the course of the ureter.  Prior to removing the ureteroscope, we did pass the Glidewire back up to the ureter to the renal pelvis.  Once the ureteroscope was removed, we then used the Glidewire under fluoroscopic guidance and passed up a 6-French x 24 cm double-pigtail ureteral stent up the ureter, making sure that the proximal and distal ends coiled within the kidney and bladder respectively.  Note that we left long tether strings attached to the distal end of the ureteral stents and it exited the urethral meatus and was secured to the inner thigh with a tegaderm adhesive.  The cystoscope was then advanced back into the bladder under vision.  We were able to see the distal stent coiling nicely within the bladder.  The bladder was then emptied with irrigation solution.  The cystoscope was then removed.    The patient tolerated the procedure well and there was no complication. Patient was awoken from anesthesia and taken to the recovery room in stable condition. I was present and scrubbed for the entirety of the case.  Plan:  Patient will be discharged home.  She will remove her stents by pulling on attached strings on Thursday morning.  She will follow-up with me in 1 month with renal ultrasound.  [facility]  Pager:  [phone] ° °

## 2021-08-28 NOTE — Discharge Instructions (Addendum)
[facility] [phone] Post Ureteroscopy With or Without Stent Instructions  Definitions:  Ureter: The duct that transports urine from the kidney to the bladder. Stent:   A plastic hollow tube that is placed into the ureter, from the kidney to the bladder to prevent the ureter from swelling shut.  GENERAL INSTRUCTIONS:  Despite the fact that no skin incisions were used, the area around the ureter and bladder is raw and irritated. The stent is a foreign body which will further irritate the bladder wall. This irritation is manifested by increased frequency of urination, both day and night, and by an increase in the urge to urinate. In some, the urge to urinate is present almost always. Sometimes the urge is strong enough that you may not be able to stop yourself from urinating. The only real cure is to remove the stent and then give time for the bladder wall to heal which can't be done until the danger of the ureter swelling shut has passed, which varies.  You may see some blood in your urine while the stent is in place and a few days afterwards. Do not be alarmed, even if the urine was clear for a while. Get off your feet and drink lots of fluids until clearing occurs. If you start to pass clots or don't improve, call us.  DIET: You may return to your normal diet immediately. Because of the raw surface of your bladder, alcohol, spicy foods, acid type foods and drinks with caffeine may cause irritation or frequency and should be used in moderation. To keep your urine flowing freely and to avoid constipation, drink plenty of fluids during the day ( 8-10 glasses ). Tip: Avoid cranberry juice because it is very acidic.  ACTIVITY: Your physical activity doesn't need to be restricted. However, if you are very active, you may see some blood in your urine. We suggest that you reduce your activity under these circumstances until the bleeding has stopped.  BOWELS: It is important to  keep your bowels regular during the postoperative period. Straining with bowel movements can cause bleeding. A bowel movement every other day is reasonable. Use a mild laxative if needed, such as Milk of Magnesia 2-3 tablespoons, or 2 Dulcolax tablets. Call if you continue to have problems. If you have been taking narcotics for pain, before, during or after your surgery, you may be constipated. Take a laxative if necessary.   MEDICATION: You should resume your pre-surgery medications unless told not to. In addition you will often be given an antibiotic to prevent infection. These should be taken as prescribed until the bottles are finished unless you are having an unusual reaction to one of the drugs.  PROBLEMS YOU SHOULD REPORT TO [place]: Fevers over 100.5 Fahrenheit. Heavy bleeding, or clots ( See above notes about blood in urine ). Inability to urinate. Drug reactions ( hives, rash, nausea, vomiting, diarrhea ). Severe burning or pain with urination that is not improving.  FOLLOW-UP: You will need a follow-up appointment to monitor your progress. Call for this appointment at the number listed above. Usually the first appointment will be about three to fourteen days after your surgery.  You have 2 ureteral stents in place.  Remove the stents by pulling on attached string on Thursday morning.

## 2021-08-28 NOTE — Transfer of Care (Signed)
Immediate Anesthesia Transfer of Care Note  Patient: Leslie Fitzgerald  Procedure(s) Performed: CYSTOSCOPY/RETROGRADE/URETEROSCOPY/HOLMIUM LASER, STONE BASKETRY, /STENT PLACEMENT (Bilateral: Renal)  Patient Location: PACU  Anesthesia Type:General  Level of Consciousness: awake, alert  and oriented  Airway & Oxygen Therapy: Patient Spontanous Breathing and Patient connected to face mask  Post-op Assessment: Report given to RN and Post -op Vital signs reviewed and stable  Post vital signs: Reviewed and stable  Last Vitals:  Vitals Value Taken Time  BP 116/58 08/28/21 1433  Temp    Pulse 104 08/28/21 1436  Resp 15 08/28/21 1436  SpO2 94 % 08/28/21 1436  Vitals shown include unvalidated device data.  Last Pain:  Vitals:   08/28/21 1125  TempSrc: Oral  PainSc:          Complications: No notable events documented.

## 2021-08-28 NOTE — Anesthesia Preprocedure Evaluation (Addendum)
Anesthesia Evaluation  Patient identified by MRN, date of birth, ID band Patient awake    Reviewed: Allergy & Precautions, NPO status , Patient's Chart, lab work & pertinent test results  Airway Mallampati: III  TM Distance: >3 FB Neck ROM: Full    Dental no notable dental hx.    Pulmonary neg pulmonary ROS,    Pulmonary exam normal breath sounds clear to auscultation       Cardiovascular negative cardio ROS Normal cardiovascular exam Rhythm:Regular Rate:Normal     Neuro/Psych  Headaches, negative psych ROS   GI/Hepatic negative GI ROS, Neg liver ROS,   Endo/Other  negative endocrine ROS  Renal/GU Renal disease     Musculoskeletal negative musculoskeletal ROS (+)   Abdominal (+) + obese,   Peds  Hematology negative hematology ROS (+)   Anesthesia Other Findings RIGHT RENAL STONE LEFT URETERAL STONE  Reproductive/Obstetrics                            Anesthesia Physical Anesthesia Plan  ASA: 2  Anesthesia Plan: General   Post-op Pain Management:    Induction: Intravenous  PONV Risk Score and Plan: 3 and Ondansetron, Dexamethasone, Midazolam and Treatment may vary due to age or medical condition  Airway Management Planned: LMA  Additional Equipment:   Intra-op Plan:   Post-operative Plan: Extubation in OR  Informed Consent: I have reviewed the patients History and Physical, chart, labs and discussed the procedure including the risks, benefits and alternatives for the proposed anesthesia with the patient or authorized representative who has indicated his/her understanding and acceptance.     Dental advisory given  Plan Discussed with: CRNA  Anesthesia Plan Comments:        Anesthesia Quick Evaluation

## 2021-08-28 NOTE — H&P (Signed)
CC/HPI: Leslie Fitzgerald is a 56 year old female with history of urolithiasis who presents today with bilateral flank pain.   1. Urolithiasis:  -She has a long history of urolithiasis and notes that she firmed her first kidney stone in 1996. She has had M she also has a history of ureteroscopy with laser lithotripsy ET times several and ESWL.  -She presented in 07/2020 with complaints of left flank pain. CT A/P 07/22/2020 demonstrated chronic 7 mm left ureteral stone at the pelvic inlet causing moderate hydronephrosis with associated parenchymal thinning involving the left kidney. At that time, she elected medical expulsive therapy with Dr. Alyson Ingles.  -She presents in 2023 with a reported history of 1 month of bilateral flank pain. She denies fevers, chills, dysuria, gross hematuria.  -CT A/P 08/08/2021 with evidence of 7 mm distal left ureteral stone with no hydronephrosis and approximately 1 cm right lower pole stone.   Patient currently denies fever, chills, sweats, nausea, vomiting, gross hematuria or dysuria.     ALLERGIES: HYDROmorphone HCl TABS    MEDICATIONS: Bactrim Ds 800 mg-160 mg tablet 1 tablet PO BID  Percocet 5 mg-325 mg tablet 1 tablet PO Q 4 H PRN  Citalopram Hbr 10 mg tablet  Clonazepam 1 mg tablet 1 tablet PO Daily  Rosuvastatin Calcium 10 mg tablet 1 tablet PO Daily  Zofran 4 mg tablet 1 tablet PO TID PRN     GU PSH: Cysto Remove Stent FB Sim, Left - 2019 Cysto Uretero Lithotripsy, Left - 2019, 2015, 2013, 2012, 2009 Cystoscopy Insert Stent, Left - 2019, 2013, 2009 ESWL - 2009, 2009       [place] Notes: Cystoscopy With Ureteroscopy With Lithotripsy, Cystoscopy With Ureteroscopy With Lithotripsy, Cystoscopy With Insertion Of Ureteral Stent Left, Cystoscopy With Ureteroscopy With Lithotripsy, Lithotripsy, Cystoscopy With Ureteroscopy With Lithotripsy, Cystoscopy With Insertion Of Ureteral Stent Left, Cesarean Section, Lithotripsy   NON-GU PSH: Cesarean Delivery Only - 2009      GU PMH: Ureteral calculus - 07/22/2020, Calculus of ureter, - 2015, Calculus of left ureter, - 2015, Calculus of distal right ureter, - 2014, Calculus of left ureter, - 2014 Acute Cystitis/UTI, Acute cystitis without hematuria - 2015 Renal calculus, Nephrolithiasis - 2015 Gross hematuria, Gross hematuria - 2015 Other microscopic hematuria, Microscopic hematuria - 2015 Urinary Tract Inf, Unspec site, Pyuria - 2015 Abdominal Pain Unspec, Flank pain - 2015 Dorsalgia, Unspec, Dorsalgia - 2014 Flank Pain, Generalized abdominal pain - 2014 Hydronephrosis Unspec, Hydronephrosis, left - 2014      PMH Notes:  2008-02-23 14:29:58 - Note: Urinary Calculus  2008-02-23 17:33:54 - Note: CT Kidney Mass Right (___ Cm)   NON-GU PMH: Encounter for general adult medical examination without abnormal findings, Encounter for preventive health examination - 2015 Fever, unspecified, Fever - 2015 Hypercholesterolemia    FAMILY HISTORY: Acute Myocardial Infarction - Mother Death In The Family Mother - Mother Family Health Status - Father alive at age 41 - Father Family Health Status - Mother's Age - Mother Family Health Status Number - Runs In Family   SOCIAL HISTORY: Marital Status: Married Preferred Language: English; Ethnicity: Not Hispanic Or Latino; Race: White Current Smoking Status: Patient has never smoked.   Tobacco Use Assessment Completed: Used Tobacco in last 30 days? Does not use smokeless tobacco. Does not use drugs. Drinks 2 caffeinated drinks per day. Has not had a blood transfusion.     Notes: Never A Smoker, Caffeine Use, Marital History - Currently Married, Occupation:   REVIEW OF SYSTEMS:    GU  Review Female:   Patient denies frequent urination, hard to postpone urination, burning /pain with urination, get up at night to urinate, leakage of urine, stream starts and stops, trouble starting your stream, have to strain to urinate, and being pregnant.  Gastrointestinal (Upper):    Patient denies nausea, vomiting, and indigestion/ heartburn.  Gastrointestinal (Lower):   Patient denies diarrhea and constipation.  Constitutional:   Patient denies fever, night sweats, weight loss, and fatigue.  Skin:   Patient denies skin rash/ lesion and itching.  Eyes:   Patient denies blurred vision and double vision.  Ears/ Nose/ Throat:   Patient denies sinus problems and sore throat.  Hematologic/Lymphatic:   Patient denies swollen glands and easy bruising.  Cardiovascular:   Patient denies leg swelling and chest pains.  Respiratory:   Patient denies cough and shortness of breath.  Endocrine:   Patient denies excessive thirst.  Musculoskeletal:   Patient denies back pain and joint pain.  Neurological:   Patient denies headaches and dizziness.  Psychologic:   Patient denies depression and anxiety.   VITAL SIGNS: None   MULTI-SYSTEM PHYSICAL EXAMINATION:    Constitutional: Well-nourished. No physical deformities. Normally developed. Good grooming.  Respiratory: No labored breathing, no use of accessory muscles.   Cardiovascular: Normal temperature, normal extremity pulses, no swelling, no varicosities.  Gastrointestinal: No mass, no tenderness, no rigidity, non obese abdomen. No CVA tenderness.     Complexity of Data:  Source Of History:  Patient, Medical Record Summary  Records Review:   Previous Doctor Records  Urine Test Review:   Urinalysis  X-Ray Review: C.T. Abdomen/Pelvis: Reviewed Films. Reviewed Report. Discussed With Patient.     PROCEDURES:         C.T. Urogram - [id number]      Patient confirmed No Neulasta OnPro Device.         Urinalysis w/Scope Dipstick Dipstick Cont'd Micro  Color: Yellow Bilirubin: Neg mg/dL WBC/hpf: 10 - 20/hpf  Appearance: Cloudy Ketones: Neg mg/dL RBC/hpf: 3 - 10/hpf  Specific Gravity: 1.020 Blood: 1+ ery/uL Bacteria: Few (10-25/hpf)  pH: 7.0 Protein: Trace mg/dL Cystals: Amorph Urates  Glucose: Neg mg/dL Urobilinogen: 0.2 mg/dL Casts:  NS (Not Seen)    Nitrites: Neg Trichomonas: Not Present    Leukocyte Esterase: 3+ leu/uL Mucous: Not Present      Epithelial Cells: NS (Not Seen)      Yeast: NS (Not Seen)      Sperm: Not Present    ASSESSMENT:      ICD-10 Details  1 GU:   Abdominal Pain Unspec - R10.9   2   History of urolithiasis - Z87.442   3   Renal calculus - N20.0   4   Ureteral calculus - N20.1    PLAN:           Orders Labs CULTURE, URINE  X-Rays: C.T. Stone Protocol Without I.V. Contrast          Schedule         Document Letter(s):  Created for Patient: Clinical Summary         Notes:   #1. Urolithiasis:  -CT A/P 08/08/2021 with evidence of 7 mm distal left ureteral stone with no hydronephrosis and approximately 1 cm right lower pole stone.  -I discussed that she has a chronic distal left ureteral stone as well as a stable 1 cm right renal stone. I recommend intervention for at least her ureteral stone. After discussing options, she elected proceed with cystoscopy, bilateral retrograde  pyelogram, bilateral ureteroscopy with laser lithotripsy and basket extraction. We discussed risks to include infection, hematuria, injury to ureter, inability to clear stone.  -Surgery letter sent  -Obtain urine for culture today   CC: Bradly Bienenstock, NP   Urology Preoperative H&P   Chief Complaint: Bilateral stones  History of Present Illness: Leslie Fitzgerald is a 56 y.o. female with bilateral stones here for cystoscopy, bilateral retrograde pyelogram, bilateral ureteroscopy with laser lithotripsy and basket extraction.    Past Medical History:  Diagnosis Date   Headache    History of colon polyps    History of kidney stones    History of nonmelanoma skin cancer    Nephrolithiasis    BILATERAL   PONV (postoperative nausea and vomiting)    Pyelonephritis    Right ureteral stone     Past Surgical History:  Procedure Laterality Date   CESAREAN SECTION  02-12-2003   PRIMARY   COLONOSCOPY W/ POLYPECTOMY   03/ 2015   CYSTO/ LEFT RETROGRADE PYELOGRAM/ LEFT URETEROSCOPIC LASER LITHO/ STONE EXTRACTIONS  03-03-2008   CYSTOSCOPY W/ URETERAL STENT PLACEMENT  02/18/2012   Procedure: CYSTOSCOPY WITH RETROGRADE PYELOGRAM/URETERAL STENT PLACEMENT;  Surgeon: Bernestine Amass, MD;  Location: [facility];  Service: Urology;  Laterality: Left;  1 HR    CYSTOSCOPY W/ URETERAL STENT REMOVAL Right 03/15/2014   Procedure: CYSTOSCOPY WITH STENT REMOVAL;  Surgeon: Bernestine Amass, MD;  Location: [facility];  Service: Urology;  Laterality: Right;   CYSTOSCOPY WITH URETEROSCOPY AND STENT PLACEMENT Right 03/02/2014   Procedure: CYSTOSCOPY WITH RIGHT RETROGRADE AND STENT PLACEMENT;  Surgeon: Alexis Frock, MD;  Location: [facility];  Service: Urology;  Laterality: Right;   CYSTOSCOPY/URETEROSCOPY/HOLMIUM LASER/STENT PLACEMENT Left 03/24/2018   Procedure: CYSTOSCOPY/RETROGRADE  LEFT STENT PLACEMENT;  Surgeon: Irine Seal, MD;  Location: [facility];  Service: Urology;  Laterality: Left;   CYSTOSCOPY/URETEROSCOPY/HOLMIUM LASER/STENT PLACEMENT Left 04/03/2018   Procedure: CYSTOSCOPY/STENT REMOVAL/LEFT URETEROSCOPY/HOLMIUM LASER/BASKETING OF STONE;  Surgeon: Irine Seal, MD;  Location: [facility];  Service: Urology;  Laterality: Left;   D & C HYSTEROSCOPY/ NOVASURE ENDOMETRIAL ABLATION  09-11-2006   REFACTORY MENORRHAGIA   HOLMIUM LASER APPLICATION Right 5/78/4696   Procedure: HOLMIUM LASER APPLICATION;  Surgeon: Bernestine Amass, MD;  Location: [facility];  Service: Urology;  Laterality: Right;   HYSTEROSCOPY WITH D & C  10-19-2002   AUB   RIGHT URETEROSCOPIC STONE EXTRACTIONS  03-28-2011   URETEROSCOPY  02/18/2012   Procedure: URETEROSCOPY;  Surgeon: Bernestine Amass, MD;  Location: [facility];  Service: Urology;  Laterality: Left;   URETEROSCOPY Right 03/15/2014   Procedure: URETEROSCOPY;  Surgeon: Bernestine Amass, MD;  Location[facility];  Service: Urology;   Laterality: Right;    Allergies: No Known Allergies  Family History  Problem Relation Age of Onset   Breast cancer Sister    Skin cancer Sister    Skin cancer Father    Heart attack Father    Colon cancer Neg Hx    Stomach cancer Neg Hx     Social History:  reports that she has never smoked. She has never used smokeless tobacco. She reports current alcohol use. She reports that she does not use drugs.  ROS: A complete review of systems was performed.  All systems are negative except for pertinent findings as noted.  Physical Exam:  Vital signs in last 24 hours: Temp:  [99.1 F (37.3 C)] 99.1 F (37.3 C) (02/13 1125)  Pulse Rate:  [83] 83 (02/13 1125) Resp:  [16] 16 (02/13 1125) BP: (116)/(70) 116/70 (02/13 1125) SpO2:  [94 %] 94 % (02/13 1125) Weight:  [86.5 kg] 86.5 kg (02/13 1100) Constitutional:  Alert and oriented, No acute distress Cardiovascular: Regular rate and rhythm Respiratory: Normal respiratory effort, Lungs clear bilaterally GI: Abdomen is soft, nontender, nondistended, no abdominal masses GU: No CVA tenderness Lymphatic: No lymphadenopathy Neurologic: Grossly intact, no focal deficits Psychiatric: Normal mood and affect  Laboratory Data:  No results for input(s): WBC, HGB, HCT, PLT in the last 72 hours.  No results for input(s): NA, K, CL, GLUCOSE, BUN, CALCIUM, CREATININE in the last 72 hours.  Invalid input(s): CO3   No results found for this or any previous visit (from the past 24 hour(s)). No results found for this or any previous visit (from the past 240 hour(s)).  Renal Function: No results for input(s): CREATININE in the last 168 hours. CrCl cannot be calculated (Patient's most recent lab result is older than the maximum 21 days allowed.).  Radiologic Imaging: No results found.  I independently reviewed the above imaging studies.  Assessment and Plan Leslie Fitzgerald is a 56 y.o. female with bilateral stones here for cystoscopy, bilateral  retrograde pyelogram, bilateral ureteroscopy with laser lithotripsy and basket extraction.    -The risks, benefits and alternatives of cystoscopy, bilateral retrograde pyelogram, bilateral ureteroscopy with laser lithotripsy and basket extraction was discussed with the patient.  Risks include, but are not limited to: bleeding, urinary tract infection, ureteral injury, ureteral stricture disease, chronic pain, urinary symptoms, bladder injury, stent migration, the need for nephrostomy tube placement, MI, CVA, DVT, PE and the inherent risks with general anesthesia.  The patient voices understanding and wishes to proceed.     Matt R. Amron Guerrette MD 08/28/2021, 11:48 AM  [facility] Pager: [phone]): [phone]

## 2021-08-28 NOTE — Anesthesia Procedure Notes (Signed)
Procedure Name: LMA Insertion Date/Time: 08/28/2021 1:08 PM Performed by: Rosaland Lao, CRNA Pre-anesthesia Checklist: Patient identified, Emergency Drugs available, Suction available and Patient being monitored Patient Re-evaluated:Patient Re-evaluated prior to induction Oxygen Delivery Method: Circle system utilized Preoxygenation: Pre-oxygenation with 100% oxygen Induction Type: IV induction Ventilation: Mask ventilation without difficulty LMA: LMA inserted LMA Size: 4.0 Number of attempts: 1 Placement Confirmation: positive ETCO2 and breath sounds checked- equal and bilateral Tube secured with: Tape Dental Injury: Teeth and Oropharynx as per pre-operative assessment

## 2021-08-28 NOTE — Anesthesia Postprocedure Evaluation (Signed)
Anesthesia Post Note  Patient: MELANEY TELLEFSEN  Procedure(s) Performed: CYSTOSCOPY/RETROGRADE/URETEROSCOPY/HOLMIUM LASER, STONE BASKETRY, /STENT PLACEMENT (Bilateral: Renal)     Patient location during evaluation: PACU Anesthesia Type: General Level of consciousness: awake Pain management: pain level controlled Vital Signs Assessment: post-procedure vital signs reviewed and stable Respiratory status: spontaneous breathing, nonlabored ventilation, respiratory function stable and patient connected to nasal cannula oxygen Cardiovascular status: blood pressure returned to baseline and stable Postop Assessment: no apparent nausea or vomiting Anesthetic complications: no   No notable events documented.  Last Vitals:  Vitals:   08/28/21 1500 08/28/21 1507  BP: 130/69 136/78  Pulse:  98  Resp: 14   Temp: 36.5 C 36.6 C  SpO2: 93% 91%    Last Pain:  Vitals:   08/28/21 1507  TempSrc:   PainSc: 0-No pain                 Jibri Schriefer P Lamario Mani

## 2021-08-29 ENCOUNTER — Encounter (HOSPITAL_COMMUNITY): Payer: Self-pay | Admitting: Urology

## 2021-08-30 ENCOUNTER — Emergency Department (HOSPITAL_COMMUNITY): Payer: BC Managed Care – PPO

## 2021-08-30 ENCOUNTER — Encounter (HOSPITAL_COMMUNITY): Payer: Self-pay

## 2021-08-30 ENCOUNTER — Other Ambulatory Visit: Payer: Self-pay

## 2021-08-30 ENCOUNTER — Inpatient Hospital Stay (HOSPITAL_COMMUNITY)
Admission: EM | Admit: 2021-08-30 | Discharge: 2021-09-03 | DRG: 854 | Disposition: A | Discharge status: Home / Self Care | Payer: BC Managed Care – PPO | Attending: Family Medicine | Admitting: Family Medicine

## 2021-08-30 DIAGNOSIS — B952 Enterococcus as the cause of diseases classified elsewhere: Secondary | ICD-10-CM | POA: Diagnosis present

## 2021-08-30 DIAGNOSIS — R519 Headache, unspecified: Secondary | ICD-10-CM | POA: Diagnosis not present

## 2021-08-30 DIAGNOSIS — Z85828 Personal history of other malignant neoplasm of skin: Secondary | ICD-10-CM | POA: Diagnosis not present

## 2021-08-30 DIAGNOSIS — Z20822 Contact with and (suspected) exposure to covid-19: Secondary | ICD-10-CM | POA: Diagnosis present

## 2021-08-30 DIAGNOSIS — Z8249 Family history of ischemic heart disease and other diseases of the circulatory system: Secondary | ICD-10-CM

## 2021-08-30 DIAGNOSIS — Z87442 Personal history of urinary calculi: Secondary | ICD-10-CM

## 2021-08-30 DIAGNOSIS — N136 Pyonephrosis: Secondary | ICD-10-CM | POA: Diagnosis present

## 2021-08-30 DIAGNOSIS — F32A Depression, unspecified: Secondary | ICD-10-CM | POA: Diagnosis present

## 2021-08-30 DIAGNOSIS — R109 Unspecified abdominal pain: Secondary | ICD-10-CM | POA: Diagnosis not present

## 2021-08-30 DIAGNOSIS — N201 Calculus of ureter: Secondary | ICD-10-CM | POA: Diagnosis present

## 2021-08-30 DIAGNOSIS — Z803 Family history of malignant neoplasm of breast: Secondary | ICD-10-CM | POA: Diagnosis not present

## 2021-08-30 DIAGNOSIS — E785 Hyperlipidemia, unspecified: Secondary | ICD-10-CM | POA: Diagnosis present

## 2021-08-30 DIAGNOSIS — Z79899 Other long term (current) drug therapy: Secondary | ICD-10-CM | POA: Diagnosis not present

## 2021-08-30 DIAGNOSIS — R6520 Severe sepsis without septic shock: Secondary | ICD-10-CM | POA: Diagnosis not present

## 2021-08-30 DIAGNOSIS — R509 Fever, unspecified: Secondary | ICD-10-CM | POA: Diagnosis present

## 2021-08-30 DIAGNOSIS — A419 Sepsis, unspecified organism: Secondary | ICD-10-CM | POA: Diagnosis not present

## 2021-08-30 DIAGNOSIS — I959 Hypotension, unspecified: Secondary | ICD-10-CM | POA: Diagnosis present

## 2021-08-30 DIAGNOSIS — N179 Acute kidney failure, unspecified: Secondary | ICD-10-CM | POA: Diagnosis not present

## 2021-08-30 DIAGNOSIS — Z808 Family history of malignant neoplasm of other organs or systems: Secondary | ICD-10-CM | POA: Diagnosis not present

## 2021-08-30 DIAGNOSIS — N390 Urinary tract infection, site not specified: Secondary | ICD-10-CM | POA: Diagnosis not present

## 2021-08-30 DIAGNOSIS — N132 Hydronephrosis with renal and ureteral calculous obstruction: Secondary | ICD-10-CM | POA: Diagnosis not present

## 2021-08-30 DIAGNOSIS — E876 Hypokalemia: Secondary | ICD-10-CM

## 2021-08-30 DIAGNOSIS — F419 Anxiety disorder, unspecified: Secondary | ICD-10-CM | POA: Diagnosis present

## 2021-08-30 DIAGNOSIS — R319 Hematuria, unspecified: Secondary | ICD-10-CM | POA: Diagnosis not present

## 2021-08-30 LAB — COMPREHENSIVE METABOLIC PANEL
ALT: 54 U/L — ABNORMAL HIGH (ref 0–44)
AST: 68 U/L — ABNORMAL HIGH (ref 15–41)
Albumin: 3.6 g/dL (ref 3.5–5.0)
Alkaline Phosphatase: 62 U/L (ref 38–126)
Anion gap: 7 (ref 5–15)
BUN: 29 mg/dL — ABNORMAL HIGH (ref 6–20)
CO2: 26 mmol/L (ref 22–32)
Calcium: 8.9 mg/dL (ref 8.9–10.3)
Chloride: 101 mmol/L (ref 98–111)
Creatinine, Ser: 1.42 mg/dL — ABNORMAL HIGH (ref 0.44–1.00)
GFR, Estimated: 44 mL/min — ABNORMAL LOW (ref >60)
Glucose, Bld: 116 mg/dL — ABNORMAL HIGH (ref 70–99)
Potassium: 4 mmol/L (ref 3.5–5.1)
Sodium: 134 mmol/L — ABNORMAL LOW (ref 135–145)
Total Bilirubin: 0.8 mg/dL (ref 0.3–1.2)
Total Protein: 6.9 g/dL (ref 6.5–8.1)

## 2021-08-30 LAB — URINALYSIS, ROUTINE W REFLEX MICROSCOPIC
Bacteria, UA: NONE SEEN
RBC / HPF: >50 RBC/hpf — ABNORMAL HIGH (ref 0–5)
WBC, UA: >50 WBC/hpf — ABNORMAL HIGH (ref 0–5)

## 2021-08-30 LAB — CBC WITH DIFFERENTIAL/PLATELET
Abs Immature Granulocytes: 0.09 10*3/uL — ABNORMAL HIGH (ref 0.00–0.07)
Basophils Absolute: 0.1 10*3/uL (ref 0.0–0.1)
Basophils Relative: 0 %
Eosinophils Absolute: 0 10*3/uL (ref 0.0–0.5)
Eosinophils Relative: 0 %
HCT: 41.7 % (ref 36.0–46.0)
Hemoglobin: 13.8 g/dL (ref 12.0–15.0)
Immature Granulocytes: 1 %
Lymphocytes Relative: 7 %
Lymphs Abs: 0.8 10*3/uL (ref 0.7–4.0)
MCH: 31.1 pg (ref 26.0–34.0)
MCHC: 33.1 g/dL (ref 30.0–36.0)
MCV: 93.9 fL (ref 80.0–100.0)
Monocytes Absolute: 0.8 10*3/uL (ref 0.1–1.0)
Monocytes Relative: 7 %
Neutro Abs: 10.4 10*3/uL — ABNORMAL HIGH (ref 1.7–7.7)
Neutrophils Relative %: 85 %
Platelets: 157 10*3/uL (ref 150–400)
RBC: 4.44 MIL/uL (ref 3.87–5.11)
RDW: 12.9 % (ref 11.5–15.5)
WBC: 12.2 10*3/uL — ABNORMAL HIGH (ref 4.0–10.5)
nRBC: 0 % (ref 0.0–0.2)

## 2021-08-30 LAB — HCG, QUANTITATIVE, PREGNANCY: hCG, Beta Chain, Quant, S: 6 m[IU]/mL — ABNORMAL HIGH (ref <5)

## 2021-08-30 LAB — PROTIME-INR
INR: 1.1 (ref 0.8–1.2)
Prothrombin Time: 13.9 seconds (ref 11.4–15.2)

## 2021-08-30 LAB — RESP PANEL BY RT-PCR (FLU A&B, COVID) ARPGX2
Influenza A by PCR: NEGATIVE
Influenza B by PCR: NEGATIVE
SARS Coronavirus 2 by RT PCR: NEGATIVE

## 2021-08-30 LAB — LACTIC ACID, PLASMA: Lactic Acid, Venous: 1.4 mmol/L (ref 0.5–1.9)

## 2021-08-30 LAB — APTT: aPTT: 32 seconds (ref 24–36)

## 2021-08-30 MED ORDER — DIPHENHYDRAMINE HCL 50 MG/ML IJ SOLN
50.0000 mg | Freq: Once | INTRAMUSCULAR | Status: AC
  Administered 2021-08-30: 50 mg via INTRAVENOUS
  Filled 2021-08-30: qty 1

## 2021-08-30 MED ORDER — ONDANSETRON HCL 4 MG/2ML IJ SOLN: 4.0000 mg | Freq: Four times a day (QID) | INTRAMUSCULAR | Status: DC | PRN

## 2021-08-30 MED ORDER — DOCUSATE SODIUM 100 MG PO CAPS: 100.0000 mg | ORAL_CAPSULE | Freq: Every day | ORAL | Status: DC | PRN

## 2021-08-30 MED ORDER — OXYCODONE HCL 5 MG PO TABS
5.0000 mg | ORAL_TABLET | Freq: Four times a day (QID) | ORAL | Status: DC | PRN
  Administered 2021-08-31 – 2021-09-03 (×5): 5 mg via ORAL
  Filled 2021-08-30 (×6): qty 1

## 2021-08-30 MED ORDER — ONDANSETRON HCL 4 MG PO TABS: 4.0000 mg | ORAL_TABLET | Freq: Four times a day (QID) | ORAL | Status: DC | PRN

## 2021-08-30 MED ORDER — SODIUM CHLORIDE 0.9 % IV SOLN
1.0000 g | INTRAVENOUS | Status: DC
  Administered 2021-08-31: 1 g via INTRAVENOUS
  Filled 2021-08-30 (×2): qty 10

## 2021-08-30 MED ORDER — BUSPIRONE HCL 5 MG PO TABS
10.0000 mg | ORAL_TABLET | Freq: Two times a day (BID) | ORAL | Status: DC
  Administered 2021-08-30 – 2021-09-03 (×8): 10 mg via ORAL
  Filled 2021-08-30 (×5): qty 2
  Filled 2021-08-30: qty 1
  Filled 2021-08-30: qty 2
  Filled 2021-08-30: qty 1

## 2021-08-30 MED ORDER — OXYCODONE HCL 5 MG PO TABS
5.0000 mg | ORAL_TABLET | Freq: Once | ORAL | Status: AC
  Administered 2021-08-30: 5 mg via ORAL
  Filled 2021-08-30: qty 1

## 2021-08-30 MED ORDER — ACETAMINOPHEN 325 MG PO TABS
650.0000 mg | ORAL_TABLET | Freq: Four times a day (QID) | ORAL | Status: DC | PRN
  Administered 2021-09-02: 650 mg via ORAL
  Filled 2021-08-30 (×2): qty 2

## 2021-08-30 MED ORDER — LACTATED RINGERS IV SOLN: INTRAVENOUS | Status: AC

## 2021-08-30 MED ORDER — LACTATED RINGERS IV SOLN: INTRAVENOUS | Status: DC

## 2021-08-30 MED ORDER — ACETAMINOPHEN 650 MG RE SUPP: 650.0000 mg | Freq: Four times a day (QID) | RECTAL | Status: DC | PRN

## 2021-08-30 MED ORDER — OXYCODONE-ACETAMINOPHEN 5-325 MG PO TABS
1.0000 | ORAL_TABLET | Freq: Once | ORAL | Status: AC
  Administered 2021-08-30: 1 via ORAL
  Filled 2021-08-30: qty 1

## 2021-08-30 MED ORDER — LACTATED RINGERS IV BOLUS
30.0000 mL/kg | Freq: Once | INTRAVENOUS | Status: AC
  Administered 2021-08-30: 1503 mL via INTRAVENOUS

## 2021-08-30 MED ORDER — OXYCODONE HCL 5 MG PO TABS
5.0000 mg | ORAL_TABLET | Freq: Four times a day (QID) | ORAL | Status: DC
  Administered 2021-08-30: 5 mg via ORAL
  Filled 2021-08-30: qty 1

## 2021-08-30 MED ORDER — METOCLOPRAMIDE HCL 5 MG/ML IJ SOLN
10.0000 mg | Freq: Once | INTRAMUSCULAR | Status: AC
Start: 2021-08-30 — End: 2021-08-30
  Administered 2021-08-30: 10 mg via INTRAVENOUS
  Filled 2021-08-30: qty 2

## 2021-08-30 MED ORDER — CLONAZEPAM 1 MG PO TABS
2.0000 mg | ORAL_TABLET | Freq: Every day | ORAL | Status: DC
  Administered 2021-08-30 – 2021-09-02 (×4): 2 mg via ORAL
  Filled 2021-08-30: qty 2
  Filled 2021-08-30: qty 4
  Filled 2021-08-30 (×3): qty 2

## 2021-08-30 MED ORDER — SODIUM CHLORIDE 0.9 % IV SOLN
1.0000 g | Freq: Once | INTRAVENOUS | Status: AC
  Administered 2021-08-30: 1 g via INTRAVENOUS
  Filled 2021-08-30: qty 10

## 2021-08-30 MED ORDER — ACETAMINOPHEN 500 MG PO TABS
1000.0000 mg | ORAL_TABLET | Freq: Once | ORAL | Status: AC
  Administered 2021-08-30: 1000 mg via ORAL
  Filled 2021-08-30: qty 2

## 2021-08-30 MED ORDER — ENOXAPARIN SODIUM 40 MG/0.4ML IJ SOSY
40.0000 mg | PREFILLED_SYRINGE | INTRAMUSCULAR | Status: DC
  Administered 2021-08-31 (×2): 40 mg via SUBCUTANEOUS
  Filled 2021-08-30 (×2): qty 0.4

## 2021-08-30 MED ORDER — CITALOPRAM HYDROBROMIDE 20 MG PO TABS
10.0000 mg | ORAL_TABLET | Freq: Every day | ORAL | Status: DC
  Administered 2021-08-30 – 2021-09-03 (×5): 10 mg via ORAL
  Filled 2021-08-30 (×5): qty 1

## 2021-08-30 NOTE — ED Provider Notes (Signed)
[facility] DEPT Provider Note   CSN: [id number] Arrival date & time: 08/30/21  1152     History  Chief Complaint  Patient presents with   Fever    Leslie Fitzgerald is a 56 y.o. female with past medical history of urolithiasis (status post bilateral stent placement on 08/28/2020 with evidence of a 7 mm distal left ureteral stone 08/04/2021), headache, pyelonephritis.  Presents emergency department with a chief complaint of fever, headache, and bilateral flank pain.  Patient reports that yesterday she started developing fevers, chills, generalized body aches, and headaches.  Patient reports that she has had pain to bilateral flanks which have gotten worse over the last 24 hours.  Patient reports Tmax of 103 F orally yesterday.  Patient reports that she had been taking Tylenol with improvement in her fever.  patient last took 1000 mg Tylenol at 6 AM this morning.  Patient reports dysuria and hematuria however is unsure if this is related to her stent placement.  Patient also endorses decreased urinary output despite increased fluid intake.  Patient denies any known sick contacts.  Denies any difficulty urinating, vaginal pain, vaginal bleeding, vaginal discharge, nausea, vomiting, diarrhea, constipation, rhinorrhea, nasal congestion, cough.    Fever Associated symptoms: chills, dysuria and myalgias (Generalized)   Associated symptoms: no chest pain, no confusion, no congestion, no cough, no headaches, no nausea, no rash, no rhinorrhea and no vomiting   Fever Associated symptoms: chills, dysuria and myalgias   Associated symptoms: no chest pain, no confusion, no congestion, no cough, no headaches, no nausea, no rash, no rhinorrhea and no vomiting   Fever Associated symptoms: chills and dysuria   Associated symptoms: no chest pain, no confusion, no congestion, no cough, no headaches, no nausea, no rash, no rhinorrhea and no vomiting   Fever Associated symptoms:  chills and dysuria   Associated symptoms: no chest pain, no confusion, no congestion, no headaches, no nausea, no rash, no rhinorrhea and no vomiting   Fever Associated symptoms: chills and dysuria   Associated symptoms: no chest pain, no confusion, no congestion, no headaches, no nausea, no rash and no vomiting   Fever Associated symptoms: chills and dysuria   Associated symptoms: no chest pain, no confusion, no headaches, no nausea, no rash and no vomiting   Fever Associated symptoms: chills   Associated symptoms: no chest pain, no confusion, no dysuria, no headaches, no nausea, no rash and no vomiting   Fever Associated symptoms: no chest pain, no chills, no confusion, no dysuria, no headaches, no nausea, no rash and no vomiting       Home Medications Prior to Admission medications   Medication Sig Start Date End Date Taking? Authorizing Provider  aspirin EC 81 MG tablet Take 81 mg by mouth daily as needed (headaches). Swallow whole.    [provider]  busPIRone (BUSPAR) 10 MG tablet Take 10 mg by mouth 2 (two) times daily. 08/03/21   [provider]  cephALEXin (KEFLEX) 500 MG capsule Take 1 capsule (500 mg total) by mouth 2 (two) times daily for 6 doses. 08/28/21 08/31/21  [name], MD  citalopram (CELEXA) 10 MG tablet Take 10 mg by mouth daily. 08/21/21   [provider]  clonazePAM (KLONOPIN) 1 MG tablet Take 2 mg by mouth at bedtime. 03/12/18   [provider]  docusate sodium (COLACE) 100 MG capsule Take 1 capsule (100 mg total) by mouth daily as needed. 08/28/21 09/27/21  [name], MD  ibuprofen (ADVIL,MOTRIN) 200 MG tablet Take 600 mg by mouth every 6 (six) hours as needed (pain.).    [provider]  Multiple Vitamins-Minerals (MULTIVITAMIN WITH MINERALS) tablet Take 1 tablet by mouth daily.    [provider]  oxyCODONE-acetaminophen (PERCOCET/ROXICET) 5-325 MG tablet Take 1 tablet by mouth every 6 (six) hours as  needed for up to 18 doses for severe pain. 08/28/21   [name], MD  rosuvastatin (CRESTOR) 10 MG tablet Take 10 mg by mouth at bedtime. 07/06/21   [provider]      Allergies    Patient has no known allergies.    Review of Systems   Review of Systems  Constitutional:  Positive for chills and fever.  HENT:  Negative for congestion and rhinorrhea.   Eyes:  Negative for visual disturbance.  Respiratory:  Negative for cough and shortness of breath.   Cardiovascular:  Negative for chest pain.  Gastrointestinal:  Negative for abdominal pain, nausea and vomiting.  Genitourinary:  Positive for decreased urine volume, dysuria, flank pain and hematuria. Negative for difficulty urinating, vaginal bleeding, vaginal discharge and vaginal pain.  Musculoskeletal:  Positive for myalgias (Generalized). Negative for back pain and neck pain.  Skin:  Negative for color change and rash.  Neurological:  Negative for dizziness, syncope, light-headedness and headaches.  Psychiatric/Behavioral:  Negative for confusion.    Physical Exam Updated Vital Signs BP 105/67 (BP Location: Right Arm)    Pulse (!) 108    Temp 98.6 F (37 C) (Oral)    Resp 16    LMP 01/27/2014    SpO2 98%  Physical Exam Vitals and nursing note reviewed.  Constitutional:      General: She is not in acute distress.    Appearance: She is ill-appearing. She is not toxic-appearing or diaphoretic.  HENT:     Head: Normocephalic.  Eyes:     General: No scleral icterus.       Right eye: No discharge.        Left eye: No discharge.  Cardiovascular:     Rate and Rhythm: Normal rate.  Pulmonary:     Effort: Pulmonary effort is normal. No tachypnea, bradypnea or respiratory distress.     Breath sounds: Normal breath sounds. No stridor.  Abdominal:     General: Abdomen is flat. Bowel sounds are normal. There is no distension. There are no signs of injury.     Palpations: Abdomen is soft. There is no mass or pulsatile mass.      Tenderness: There is no abdominal tenderness. There is no right CVA tenderness, left CVA tenderness, guarding or rebound.     Hernia: There is no hernia in the umbilical area or ventral area.  Skin:    General: Skin is warm and dry.  Neurological:     General: No focal deficit present.     Mental Status: She is alert and oriented to person, place, and time.     GCS: GCS eye subscore is 4. GCS verbal subscore is 5. GCS motor subscore is 6.  Psychiatric:        Behavior: Behavior is cooperative.    ED Results / Procedures / Treatments   Labs (all labs ordered are listed, but only abnormal results are displayed) Labs Reviewed  COMPREHENSIVE METABOLIC PANEL - Abnormal; Notable for the following components:      Result Value   Sodium 134 (*)    Glucose, Bld 116 (*)    BUN 29 (*)  Creatinine, Ser 1.42 (*)    AST 68 (*)    ALT 54 (*)    GFR, Estimated 44 (*)    All other components within normal limits  CBC WITH DIFFERENTIAL/PLATELET - Abnormal; Notable for the following components:   WBC 12.2 (*)    Neutro Abs 10.4 (*)    Abs Immature Granulocytes 0.09 (*)    All other components within normal limits  HCG, QUANTITATIVE, PREGNANCY - Abnormal; Notable for the following components:   hCG, Beta Chain, Quant, S 6 (*)    All other components within normal limits  RESP PANEL BY RT-PCR (FLU A&B, COVID) ARPGX2  CULTURE, BLOOD (ROUTINE X 2)  CULTURE, BLOOD (ROUTINE X 2)  URINE CULTURE  LACTIC ACID, PLASMA  PROTIME-INR  APTT  URINALYSIS, ROUTINE W REFLEX MICROSCOPIC    EKG EKG Interpretation  Date/Time:  Wednesday August 30 2021 13:05:11 EST Ventricular Rate:  102 PR Interval:  132 QRS Duration: 96 QT Interval:  326 QTC Calculation: 425 R Axis:   53 Text Interpretation: Sinus tachycardia No old tracing for comparison Confirmed by Nanda Quinton [id number]) on 08/30/2021 1:07:46 PM  Radiology DG Chest Port 1 View  Result Date: 08/30/2021 CLINICAL DATA:  Fever and fatigue  since yesterday. Recent ureteral stent placement 2 days ago. EXAM: PORTABLE CHEST 1 VIEW COMPARISON:  None. FINDINGS: The heart size and mediastinal contours are within normal limits. Both lungs are clear. The visualized skeletal structures are unremarkable. IMPRESSION: No active disease. Electronically Signed   By: Titus Dubin M.D.   On: 08/30/2021 12:49   DG C-Arm 1-60 Min-No Report  Result Date: 08/28/2021 Fluoroscopy was utilized by the requesting physician.  No radiographic interpretation.   DG C-Arm 1-60 Min-No Report  Result Date: 08/28/2021 Fluoroscopy was utilized by the requesting physician.  No radiographic interpretation.    Procedures .Critical Care Performed by: Loni Beckwith, PA-C Authorized by: Loni Beckwith, PA-C   Critical care provider statement:    Critical care time (minutes):  30   Critical care was necessary to treat or prevent imminent or life-threatening deterioration of the following conditions:  Sepsis   Critical care was time spent personally by me on the following activities:  Development of treatment plan with patient or surrogate, discussions with consultants, evaluation of patient's response to treatment, examination of patient, ordering and review of laboratory studies, ordering and review of radiographic studies, ordering and performing treatments and interventions, pulse oximetry, re-evaluation of patient's condition and review of old charts   Care discussed with: admitting provider      Medications Ordered in ED Medications  lactated ringers infusion (has no administration in time range)  cefTRIAXone (ROCEPHIN) 1 g in sodium chloride 0.9 % 100 mL IVPB (0 g Intravenous Stopped 08/30/21 1448)  lactated ringers bolus 1,503 mL (1,503 mLs Intravenous New Bag/Given 08/30/21 1335)  acetaminophen (TYLENOL) tablet 1,000 mg (1,000 mg Oral Given 08/30/21 1336)  metoCLOPramide (REGLAN) injection 10 mg (10 mg Intravenous Given 08/30/21 1448)   diphenhydrAMINE (BENADRYL) injection 50 mg (50 mg Intravenous Given 08/30/21 1448)    ED Course/ Medical Decision Making/ A&P Clinical Course as of 08/30/21 1526  Wed Aug 30, 2021  1526 Chloride: 101 [PB]    Clinical Course User Index [PB] Loni Beckwith, PA-C                           Medical Decision Making Amount and/or Complexity of Data Reviewed Labs: ordered.  Radiology: ordered.  Risk OTC drugs. Prescription drug management. Decision regarding hospitalization.   Alert 56 year old female no acute distress, nontoxic-appearing.  Patient does appear ill.  Presents emergency department with a complaint of fever, generalized body aches, headache, and bilateral flank pain.  Information was obtained from patient.  Past medical records were reviewed including previous provider notes, labs, and imaging.  Patient has medical history as noted in HPI which complicates care.  Per chart review patient had bilateral stents placed on 08/28/21 for 7 mm left distal ureteral stone which was identified on 08/04/21.  Patient reports that she was seen at [facility] urology earlier today and told by vision Dr.Boarden to go to the emergency department for admission due to sepsis.   Patient noted to be tachycardic.  Will obtain rectal temperature.  ED evolving sepsis work-up initiated.  Due to concern for urinary source we will treat patient with ceftriaxone empirically.  Additionally will place order for 30 mL/kg fluid bolus of lactated Ringer's.  Patient ordered Tylenol for headache.  Will obtain KUB and ultrasound imaging to evaluate for stent placement.  Chest x-ray was independently reviewed myself and shows no active cardiopulmonary disease.  Ultrasound imaging shows no hydronephrosis.  KUB shows stents appropriately positioned.  EKG was independently reviewed by myself myself and shows sinus tachycardia.  Lab work was independently reviewed myself.  Pertinent findings  include: -Leukocytosis at 12.2 -BUN elevated 29 creatinine elevated 1.42 -AST and ALT slightly elevated at 68 and 54 -Lactic acid within normal limits -Respiratory panel negative for COVID-19 and influenza  Patient had continued headache after receiving Tylenol and fluids.  Will place migraine cocktail for further management of patient's headache.  Urinalysis pending at this time however with recent procedure likely source of infection.  Code sepsis activated at this time.  Will consult urology and hospitalist for admission.  I spoke to on-call urologist Dr. Alinda Money reported that patient had a systolic blood pressure in the 60s at his office earlier today with reports of fever.  States that urine did look infected.  He tried to direct admit the patient however with prolonged wait time and concern for sepsis shock was sent to the [facility] for further evaluation.  Advises that he will stay on his consult.  I spoke with the hospitalist who will see the patient for admission.  Patient care was discussed with attending physician Dr. Francia Greaves.        Final Clinical Impression(s) / ED Diagnoses Final diagnoses:  Flank pain  Sepsis with acute renal failure without septic shock, due to unspecified organism, unspecified acute renal failure type [facility])    Rx / DC Orders ED Discharge Orders     None         Dyann Ruddle 08/30/21 1504    Valarie Merino, MD 08/31/21 1800

## 2021-08-30 NOTE — Sepsis Progress Note (Signed)
[facility] is following this code sepsis ?

## 2021-08-30 NOTE — H&P (Addendum)
TRH H&P    Patient Demographics:    Leslie Fitzgerald, is a 56 y.o. female  MRN: [id number]  DOB - September 26, 1965  Admit Date - 08/30/2021  Referring MD/NP/PA: Dene Gentry  Outpatient Primary MD for the patient is Jettie Booze, NP  Patient coming from: Home  Chief complaint-fever   HPI:    Leslie Fitzgerald  is a 56 y.o. female, with history of kidney stone, ureteral stone, headaches came to hospital from urologist office for fever, generalized body aches and headache.  Patient had bilateral ureteral stents placed on 08/28/2021 for a 7 mm left distal ureteral stone which was identified on 08/04/2021.  She also had 5 mm right lower pole stone which was dusted with lithotripsy. Patient says that she developed fever yesterday which was as high as 10 F, she also developed headache last night.  Denies dysuria, frequency of urination, complains of hematuria, denies flank pain or abdominal pain.  No nausea vomiting or diarrhea.  Denies chest pain or shortness of breath.  Denies cough.  Denies sore throat.  No neck pain or stiffness. Today she was seen at [facility] urology office where she was found to have abnormal UA and SBP was in 60s as per ED notes.  She was sent to ED for further evaluation for sepsis. On presentation the ED her blood pressure was 105/67, temperature 98.6, pulse 108.  Later temperature went up to 101.4. Lactic acid 1.4 Patient was started on 30 cc/kg fluid bolus for sepsis. Also patient was started on IV Rocephin for sepsis due to UTI.     Review of systems:    In addition to the HPI above,    All other systems reviewed and are negative.    Past History of the following :    Past Medical History:  Diagnosis Date   Headache    History of colon polyps    History of kidney stones    History of nonmelanoma skin cancer    Nephrolithiasis    BILATERAL   PONV (postoperative nausea and vomiting)     Pyelonephritis    Right ureteral stone       Past Surgical History:  Procedure Laterality Date   CESAREAN SECTION  02-12-2003   PRIMARY   COLONOSCOPY W/ POLYPECTOMY  03/ 2015   CYSTO/ LEFT RETROGRADE PYELOGRAM/ LEFT URETEROSCOPIC LASER LITHO/ STONE EXTRACTIONS  03-03-2008   CYSTOSCOPY W/ URETERAL STENT PLACEMENT  02/18/2012   Procedure: CYSTOSCOPY WITH RETROGRADE PYELOGRAM/URETERAL STENT PLACEMENT;  Surgeon: Bernestine Amass, MD;  Location: [facility];  Service: Urology;  Laterality: Left;  1 HR    CYSTOSCOPY W/ URETERAL STENT REMOVAL Right 03/15/2014   Procedure: CYSTOSCOPY WITH STENT REMOVAL;  Surgeon: Bernestine Amass, MD;  Location: [facility];  Service: Urology;  Laterality: Right;   CYSTOSCOPY WITH URETEROSCOPY AND STENT PLACEMENT Right 03/02/2014   Procedure: CYSTOSCOPY WITH RIGHT RETROGRADE AND STENT PLACEMENT;  Surgeon: Alexis Frock, MD;  Location: [facility];  Service: Urology;  Laterality: Right;   CYSTOSCOPY/URETEROSCOPY/HOLMIUM LASER/STENT PLACEMENT Left 03/24/2018  Procedure: CYSTOSCOPY/RETROGRADE  LEFT STENT PLACEMENT;  Surgeon: Irine Seal, MD;  Location: [facility];  Service: Urology;  Laterality: Left;   CYSTOSCOPY/URETEROSCOPY/HOLMIUM LASER/STENT PLACEMENT Left 04/03/2018   Procedure: CYSTOSCOPY/STENT REMOVAL/LEFT URETEROSCOPY/HOLMIUM LASER/BASKETING OF STONE;  Surgeon: Irine Seal, MD;  Location: [facility];  Service: Urology;  Laterality: Left;   CYSTOSCOPY/URETEROSCOPY/HOLMIUM LASER/STENT PLACEMENT Bilateral 08/28/2021   Procedure: CYSTOSCOPY/RETROGRADE/URETEROSCOPY/HOLMIUM LASER, STONE BASKETRY, /STENT PLACEMENT;  Surgeon: [name], MD;  Location: [facility];  Service: Urology;  Laterality: Bilateral;   D & C HYSTEROSCOPY/ NOVASURE ENDOMETRIAL ABLATION  09-11-2006   REFACTORY MENORRHAGIA   HOLMIUM LASER APPLICATION Right 4/76/5465   Procedure: HOLMIUM LASER APPLICATION;  Surgeon: Bernestine Amass, MD;  Location: [facility];  Service:  Urology;  Laterality: Right;   HYSTEROSCOPY WITH D & C  10-19-2002   AUB   RIGHT URETEROSCOPIC STONE EXTRACTIONS  03-28-2011   URETEROSCOPY  02/18/2012   Procedure: URETEROSCOPY;  Surgeon: Bernestine Amass, MD;  Location: [facility];  Service: Urology;  Laterality: Left;   URETEROSCOPY Right 03/15/2014   Procedure: URETEROSCOPY;  Surgeon: Bernestine Amass, MD;  Location: [facility];  Service: Urology;  Laterality: Right;      Social History:      Social History   Tobacco Use   Smoking status: Never   Smokeless tobacco: Never  Substance Use Topics   Alcohol use: Yes    Comment: occasional       Family History :     Family History  Problem Relation Age of Onset   Breast cancer Sister    Skin cancer Sister    Skin cancer Father    Heart attack Father    Colon cancer Neg Hx    Stomach cancer Neg Hx       Home Medications:   Prior to Admission medications   Medication Sig Start Date End Date Taking? Authorizing Provider  aspirin EC 81 MG tablet Take 81 mg by mouth daily as needed (headaches). Swallow whole.    [provider]  busPIRone (BUSPAR) 10 MG tablet Take 10 mg by mouth 2 (two) times daily. 08/03/21   [provider]  cephALEXin (KEFLEX) 500 MG capsule Take 1 capsule (500 mg total) by mouth 2 (two) times daily for 6 doses. 08/28/21 08/31/21  [name], MD  citalopram (CELEXA) 10 MG tablet Take 10 mg by mouth daily. 08/21/21   [provider]  clonazePAM (KLONOPIN) 1 MG tablet Take 2 mg by mouth at bedtime. 03/12/18   [provider]  docusate sodium (COLACE) 100 MG capsule Take 1 capsule (100 mg total) by mouth daily as needed. 08/28/21 09/27/21  [name], MD  ibuprofen (ADVIL,MOTRIN) 200 MG tablet Take 600 mg by mouth every 6 (six) hours as needed (pain.).    [provider]  Multiple Vitamins-Minerals (MULTIVITAMIN WITH MINERALS) tablet Take 1 tablet by mouth daily.    [provider]  oxyCODONE-acetaminophen (PERCOCET/ROXICET) 5-325 MG tablet Take 1 tablet by mouth every 6 (six) hours as needed for up to 18 doses for severe pain. 08/28/21   [name], MD  rosuvastatin (CRESTOR) 10 MG tablet Take 10 mg by mouth at bedtime. 07/06/21   [provider]     Allergies:    No Known Allergies   Physical Exam:   Vitals  Blood pressure 123/62, pulse (!) 115, temperature (!) 101.4 F (38.6 C), temperature source Rectal, resp. rate 16, last menstrual period 01/27/2014,  SpO2 100 %.  1.  General: Appears in no acute distress  2. Psychiatric: Alert, oriented x3, intact insight and judgment  3. Neurologic: Cranial nerves II through XII grossly intact, moving all extremities, no focal deficit noted  4. HEENMT:  Atraumatic normocephalic, extraocular muscles are intact, no neck stiffness  5. Respiratory : Clear to auscultation bilaterally  6. Cardiovascular : S1-S2, regular, no murmur auscultated  7. Gastrointestinal:  Abdomen is soft, nontender, no organomegaly  8. Skin:  No rashes noted     Data Review:    CBC Recent Labs  Lab 08/25/21 0835 08/30/21 1252  WBC 5.0 12.2*  HGB 15.2* 13.8  HCT 45.6 41.7  PLT 201 157  MCV 92.7 93.9  MCH 30.9 31.1  MCHC 33.3 33.1  RDW 12.5 12.9  LYMPHSABS  --  0.8  MONOABS  --  0.8  EOSABS  --  0.0  BASOSABS  --  0.1   ------------------------------------------------------------------------------------------------------------------  Results for orders placed or performed during the hospital encounter of 08/30/21 (from the past 48 hour(s))  Comprehensive metabolic panel     Status: Abnormal   Collection Time: 08/30/21 12:52 PM  Result Value Ref Range   Sodium 134 (L) 135 - 145 mmol/L   Potassium 4.0 3.5 - 5.1 mmol/L   Chloride 101 98 - 111 mmol/L   CO2 26 22 - 32 mmol/L   Glucose, Bld 116 (H) 70 - 99 mg/dL    Comment: Glucose reference range applies only to samples taken after fasting  for at least 8 hours.   BUN 29 (H) 6 - 20 mg/dL   Creatinine, Ser 1.42 (H) 0.44 - 1.00 mg/dL   Calcium 8.9 8.9 - 10.3 mg/dL   Total Protein 6.9 6.5 - 8.1 g/dL   Albumin 3.6 3.5 - 5.0 g/dL   AST 68 (H) 15 - 41 U/L   ALT 54 (H) 0 - 44 U/L   Alkaline Phosphatase 62 38 - 126 U/L   Total Bilirubin 0.8 0.3 - 1.2 mg/dL   GFR, Estimated 44 (L) >60 mL/min    Comment: (NOTE) Calculated using the CKD-EPI Creatinine Equation (2021)    Anion gap 7 5 - 15    Comment: Performed at [facility], [place] [address], [place], [place] [address]  CBC WITH DIFFERENTIAL     Status: Abnormal   Collection Time: 08/30/21 12:52 PM  Result Value Ref Range   WBC 12.2 (H) 4.0 - 10.5 K/uL   RBC 4.44 3.87 - 5.11 MIL/uL   Hemoglobin 13.8 12.0 - 15.0 g/dL   HCT 41.7 36.0 - 46.0 %   MCV 93.9 80.0 - 100.0 fL   MCH 31.1 26.0 - 34.0 pg   MCHC 33.1 30.0 - 36.0 g/dL   RDW 12.9 11.5 - 15.5 %   Platelets 157 150 - 400 K/uL   nRBC 0.0 0.0 - 0.2 %   Neutrophils Relative % 85 %   Neutro Abs 10.4 (H) 1.7 - 7.7 K/uL   Lymphocytes Relative 7 %   Lymphs Abs 0.8 0.7 - 4.0 K/uL   Monocytes Relative 7 %   Monocytes Absolute 0.8 0.1 - 1.0 K/uL   Eosinophils Relative 0 %   Eosinophils Absolute 0.0 0.0 - 0.5 K/uL   Basophils Relative 0 %   Basophils Absolute 0.1 0.0 - 0.1 K/uL   Immature Granulocytes 1 %   Abs Immature Granulocytes 0.09 (H) 0.00 - 0.07 K/uL    Comment: Performed at [facility], [address] [place], [place] [address]  Protime-INR     Status: None   Collection Time: 08/30/21 12:52 PM  Result Value Ref Range   Prothrombin Time 13.9 11.4 - 15.2 seconds   INR 1.1 0.8 - 1.2    Comment: (NOTE) INR goal varies based on device and disease states. Performed at [facility], [place] [address] [place], [place] [address]   APTT     Status: None   Collection Time: 08/30/21 12:52 PM  Result Value Ref Range   aPTT 32 24 - 36 seconds    Comment: Performed at  [facility], [place] [address] [place], [place] [address]  Resp Panel by RT-PCR (Flu A&B, Covid) Nasopharyngeal Swab     Status: None   Collection Time: 08/30/21 12:54 PM   Specimen: Nasopharyngeal Swab; Nasopharyngeal(NP) swabs in vial transport medium  Result Value Ref Range   SARS Coronavirus 2 by RT PCR NEGATIVE NEGATIVE    Comment: (NOTE) SARS-CoV-2 target nucleic acids are NOT DETECTED.  The SARS-CoV-2 RNA is generally detectable in upper respiratory specimens during the acute phase of infection. The lowest concentration of SARS-CoV-2 viral copies this assay can detect is 138 copies/mL. A negative result does not preclude SARS-Cov-2 infection and should not be used as the sole basis for treatment or other patient management decisions. A negative result may occur with  improper specimen collection/handling, submission of specimen other than nasopharyngeal swab, presence of viral mutation(s) within the areas targeted by this assay, and inadequate number of viral copies(<138 copies/mL). A negative result must be combined with clinical observations, patient history, and epidemiological information. The expected result is Negative.  Fact Sheet for Patients:  EntrepreneurPulse.com.au  Fact Sheet for Healthcare Providers:  IncredibleEmployment.be  This test is no t yet approved or cleared by the [place] FDA and  has been authorized for detection and/or diagnosis of SARS-CoV-2 by FDA under an Emergency Use Authorization (EUA). This EUA will remain  in effect (meaning this test can be used) for the duration of the COVID-19 declaration under Section 564(b)(1) of the Act, 21 U.S.C.section 360bbb-3(b)(1), unless the authorization is terminated  or revoked sooner.       Influenza A by PCR NEGATIVE NEGATIVE   Influenza B by PCR NEGATIVE NEGATIVE    Comment: (NOTE) The Xpert Xpress SARS-CoV-2/FLU/RSV plus assay is intended as  an aid in the diagnosis of influenza from Nasopharyngeal swab specimens and should not be used as a sole basis for treatment. Nasal washings and aspirates are unacceptable for Xpert Xpress SARS-CoV-2/FLU/RSV testing.  Fact Sheet for Patients: EntrepreneurPulse.com.au  Fact Sheet for Healthcare Providers: IncredibleEmployment.be  This test is not yet approved or cleared by the [place] FDA and has been authorized for detection and/or diagnosis of SARS-CoV-2 by FDA under an Emergency Use Authorization (EUA). This EUA will remain in effect (meaning this test can be used) for the duration of the COVID-19 declaration under Section 564(b)(1) of the Act, 21 U.S.C. section 360bbb-3(b)(1), unless the authorization is terminated or revoked.  Performed at [facility], [place] [address] [place], [place] [address]   Lactic acid, plasma     Status: None   Collection Time: 08/30/21 12:55 PM  Result Value Ref Range   Lactic Acid, Venous 1.4 0.5 - 1.9 mmol/L    Comment: Performed at [facility], [place] [address] [place], [place] [address]  hCG, quantitative, pregnancy     Status: Abnormal   Collection Time:  08/30/21 12:55 PM  Result Value Ref Range   hCG, Beta Chain, Quant, S 6 (H) <5 mIU/mL    Comment:          GEST. AGE      CONC.  (mIU/mL)   <=1 WEEK        5 - 50     2 WEEKS       50 - 500     3 WEEKS       100 - 10,000     4 WEEKS     1,000 - 30,000     5 WEEKS     3,500 - 115,000   6-8 WEEKS     12,000 - 270,000    12 WEEKS     15,000 - 220,000        FEMALE AND NON-PREGNANT FEMALE:     LESS THAN 5 mIU/mL Performed at [facility], [place] [address] [place], [place] [address]     Chemistries  Recent Labs  Lab 08/30/21 1252  NA 134*  K 4.0  CL 101  CO2 26  GLUCOSE 116*  BUN 29*  CREATININE 1.42*  CALCIUM 8.9  AST 68*  ALT 54*  ALKPHOS 62  BILITOT 0.8    ------------------------------------------------------------------------------------------------------------------  ------------------------------------------------------------------------------------------------------------------ GFR: Estimated Creatinine Clearance: 46.3 mL/min (A) (by C-G formula based on SCr of 1.42 mg/dL (H)). Liver Function Tests: Recent Labs  Lab 08/30/21 1252  AST 68*  ALT 54*  ALKPHOS 62  BILITOT 0.8  PROT 6.9  ALBUMIN 3.6   No results for input(s): LIPASE, AMYLASE in the last 168 hours. No results for input(s): AMMONIA in the last 168 hours. Coagulation Profile: Recent Labs  Lab 08/30/21 1252  INR 1.1   Cardiac Enzymes: No results for input(s): CKTOTAL, CKMB, CKMBINDEX, TROPONINI in the last 168 hours. BNP (last 3 results) No results for input(s): PROBNP in the last 8760 hours. HbA1C: No results for input(s): HGBA1C in the last 72 hours. CBG: No results for input(s): GLUCAP in the last 168 hours. Lipid Profile: No results for input(s): CHOL, HDL, LDLCALC, TRIG, CHOLHDL, LDLDIRECT in the last 72 hours. Thyroid Function Tests: No results for input(s): TSH, T4TOTAL, FREET4, T3FREE, THYROIDAB in the last 72 hours. Anemia Panel: No results for input(s): VITAMINB12, FOLATE, FERRITIN, TIBC, IRON, RETICCTPCT in the last 72 hours.  --------------------------------------------------------------------------------------------------------------- Urine analysis:    Component Value Date/Time   COLORURINE YELLOW 03/23/2018 2149   APPEARANCEUR HAZY (A) 03/23/2018 2149   LABSPEC 1.013 03/23/2018 2149   PHURINE 8.0 03/23/2018 2149   GLUCOSEU NEGATIVE 03/23/2018 2149   HGBUR SMALL (A) 03/23/2018 2149   BILIRUBINUR NEGATIVE 03/23/2018 2149   [place] NEGATIVE 03/23/2018 2149   PROTEINUR NEGATIVE 03/23/2018 2149   NITRITE NEGATIVE 03/23/2018 2149   LEUKOCYTESUR MODERATE (A) 03/23/2018 2149      Imaging Results:    DG Abdomen 1 View  Result Date:  08/30/2021 CLINICAL DATA:  Assess stent placement. EXAM: ABDOMEN - 1 VIEW COMPARISON:  Abdominal x-ray from same day. Intraoperative x-rays dated August 28, 2021. FINDINGS: Bilateral double-J ureteral stents are appropriately positioned. No visible renal calculi by x-ray. Unchanged phleboliths in the pelvis. The bowel gas pattern is normal. IMPRESSION: 1. Bilateral double-J ureteral stents are appropriately positioned. Electronically Signed   By: Titus Dubin M.D.   On: 08/30/2021 13:58   US RENAL  Result Date: 08/30/2021 CLINICAL DATA:  Bilateral flank pain. Recent ureteral stent placement. EXAM: RENAL / URINARY TRACT ULTRASOUND COMPLETE COMPARISON:  Radiographs 08/30/2021, CT 08/08/2021 and renal ultrasound 07/24/2012. FINDINGS: Right Kidney: Renal measurements: 13.2 x 7.1 x 6.8 cm = volume: 336.1 mL. Multiple nonobstructing renal calculi are demonstrated. Mild prominence of the renal pelvis without hydronephrosis. The ureteral stent is not visualized within the right renal pelvis. Left Kidney: Renal measurements: 11.5 x 5.0 x 5.3 cm = volume: 159.4 mL. Multiple nonobstructing renal calculi are demonstrated. There is diffuse cortical thinning and scarring. Mild prominence of the renal pelvis without hydronephrosis. The ureteral stent is not visualized within the left renal pelvis. Bladder: Partially decompressed, likely accounting for mild bladder wall thickening. The distal aspects of the ureteral stents are visualized. Other: Small renal cysts are noted. IMPRESSION: 1. Mild prominence of both renal pelves following ureteral stent placement. No hydronephrosis. 2. Nonobstructing bilateral renal calculi with left renal cortical scarring. Electronically Signed   By: Richardean Sale M.D.   On: 08/30/2021 13:49   DG Chest Port 1 View  Result Date: 08/30/2021 CLINICAL DATA:  Fever and fatigue since yesterday. Recent ureteral stent placement 2 days ago. EXAM: PORTABLE CHEST 1 VIEW COMPARISON:  None.  FINDINGS: The heart size and mediastinal contours are within normal limits. Both lungs are clear. The visualized skeletal structures are unremarkable. IMPRESSION: No active disease. Electronically Signed   By: Titus Dubin M.D.   On: 08/30/2021 12:49    My personal review of EKG: Rhythm NSR, no ST changes   Assessment & Plan:    Principal Problem:   Sepsis ([place])   Sepsis due to UTI-patient presented with sepsis due to UTI, lactic acid 1.4.  Blood pressure significantly improved at this time.  Patient was started on 30 cc/kg fluid bolus with 1.5 L of LR.  Patient blood pressure has been stable for the past 4 hours.  Will change IV fluids to LR at 100 mL/h to avoid getting fluid overload.  Continue IV Rocephin for UTI.  Follow urine analysis and culture. S/p bilateral ureteral stent placement-renal ultrasound obtained in the ED showed mild prominence of both renal pelvis following ureteral placement.  No hydronephrosis.  Nonobstructing bilateral renal calculi with left renal cortical scarring.  Urology will follow patient in the hospital. Acute kidney injury-creatinine is elevated to 1.42.  Baseline creatinine 1.02 as of 2019.  Started on IV fluids as above.  Follow renal function in a.m. Headache-no meningismus.  Start oxycodone 5 mg every 4 hours as needed Hyperlipidemia-hold Crestor, patient has minimal elevation of LFTs.  Follow LFTs in a.m. Anxiety/depression-patient is on BuSpar, Klonopin, Celexa.  We will continue with these medications.   DVT Prophylaxis-   Lovenox   AM Labs Ordered, also please review Full Orders  Family Communication: Admission, patients condition and plan of care including tests being ordered have been discussed with the patient and  who indicate understanding and agree with the plan and Code Status.  Code Status: Full code  Admission status: Inpatient :The appropriate admission status for this patient is INPATIENT. Inpatient status is judged to be reasonable  and necessary in order to provide the required intensity of service to ensure the patient's safety. The patient's presenting symptoms, physical exam findings, and initial radiographic and laboratory data in the context of their chronic comorbidities is felt to place them at high risk for further clinical deterioration. Furthermore, it is not anticipated that the patient will be medically stable for discharge from the hospital within 2 midnights of admission. The following factors support the admission status of inpatient.     I  certify that at the point of admission it is my clinical judgment that the patient will require inpatient hospital care spanning beyond 2 midnights from the point of admission due to high intensity of service, high risk for further deterioration and high frequency of surveillance required.*  Time spent in minutes : 60 minutes   Micha Dosanjh S Mando Blatz M.D

## 2021-08-30 NOTE — Progress Notes (Signed)
Patient ID: Leslie Fitzgerald, female   DOB: [birth year], 56 y.o.   MRN: [id number]    Office Visit Report     08/30/2021   --------------------------------------------------------------------------------   Leslie Fitzgerald. [place]  MRN: [id number]  DOB: Aug 02, 1965, 56 year old Female  SSN: [id number]   PRIMARY CARE:  Bradly Bienenstock, NP  REFERRING:    PROVIDER:  Rexene Alberts, M.D.  TREATING:  Raynelle Bring, M.D.  LOCATION:  [facility] [phone]     --------------------------------------------------------------------------------   CC/HPI: Left ureteral and right renal calculus   Leslie Fitzgerald is a 56 year old female who underwent bilateral ureteroscopy 2 days ago by Dr. Abner Greenspan. She had a distal left ureteral stone that was removed and a right renal calculus that was also addressed and completely fragmented and dusted. She does have indwelling bilateral ureteral stents. She began developing fever yesterday and has been febrile up to 103. She has been eating and drinking and denies significant nausea. She denies significant pain. She has general malaise and myalgias. According to her and her husband, her baseline blood pressure is typically in the 59D systolic. She has not felt dizzy or lightheaded.     ALLERGIES: HYDROmorphone HCl TABS    MEDICATIONS: Percocet 5 mg-325 mg tablet 1 tablet PO Q 4 H PRN  Percocet 5 mg-325 mg tablet 1 tablet PO Q 4 H PRN  Citalopram Hbr 10 mg tablet  Clonazepam 1 mg tablet 1 tablet PO Daily  Rosuvastatin Calcium 10 mg tablet 1 tablet PO Daily  Zofran 4 mg tablet 1 tablet PO TID PRN     GU PSH: Cysto Remove Stent FB Sim, Left - 2019 Cysto Uretero Lithotripsy, Left - 2019, 2015, 2013, 2012, 2009 Cystoscopy Insert Stent, Left - 2019, 2013, 2009 ESWL - 2009, 2009 Ureteroscopic laser litho, Bilateral - 08/28/2021       PSH Notes: Cystoscopy With Ureteroscopy With Lithotripsy, Cystoscopy With Ureteroscopy With Lithotripsy, Cystoscopy With Insertion Of Ureteral Stent  Left, Cystoscopy With Ureteroscopy With Lithotripsy, Lithotripsy, Cystoscopy With Ureteroscopy With Lithotripsy, Cystoscopy With Insertion Of Ureteral Stent Left, Cesarean Section, Lithotripsy   NON-GU PSH: Cesarean Delivery Only - 2009     GU PMH: Abdominal Pain Unspec - 08/08/2021, Flank pain, - 2015 History of urolithiasis - 08/08/2021 Renal calculus (Stable) - 08/08/2021, Nephrolithiasis, - 2015 Ureteral calculus (Stable) - 08/08/2021, - 07/22/2020, Calculus of ureter, - 2015, Calculus of left ureter, - 2015, Calculus of distal right ureter, - 2014, Calculus of left ureter, - 2014 Acute Cystitis/UTI, Acute cystitis without hematuria - 2015 Gross hematuria, Gross hematuria - 2015 Other microscopic hematuria, Microscopic hematuria - 2015 Urinary Tract Inf, Unspec site, Pyuria - 2015 Dorsalgia, Unspec, Dorsalgia - 2014 Flank Pain, Generalized abdominal pain - 2014 Hydronephrosis Unspec, Hydronephrosis, left - 2014      PMH Notes:  2008-02-23 14:29:58 - Note: Urinary Calculus  2008-02-23 17:33:54 - Note: CT Kidney Mass Right (___ Cm)   NON-GU PMH: Encounter for general adult medical examination without abnormal findings, Encounter for preventive health examination - 2015 Fever, unspecified, Fever - 2015 Hypercholesterolemia    FAMILY HISTORY: Acute Myocardial Infarction - Mother Death In The Family Mother - Mother Family Health Status - Father alive at age 19 - Father Family Health Status - Mother's Age - Mother Family Health Status Number - Runs In Family   SOCIAL HISTORY: Marital Status: Married Preferred Language: English; Ethnicity: Not Hispanic Or Latino; Race: White Current Smoking Status: Patient has never smoked.   Tobacco Use Assessment Completed: Used  Tobacco in last 30 days? Does not use smokeless tobacco. Does not use drugs. Drinks 2 caffeinated drinks per day. Has not had a blood transfusion.     Notes: Never A Smoker, Caffeine Use, Marital History - Currently  Married, Occupation:   REVIEW OF SYSTEMS:    GU Review Female:   Patient denies frequent urination, hard to postpone urination, burning /pain with urination, get up at night to urinate, leakage of urine, stream starts and stops, trouble starting your stream, have to strain to urinate, and currently pregnant.  Gastrointestinal (Lower):   Patient denies diarrhea and constipation.  Gastrointestinal (Upper):   Patient denies nausea and vomiting.  Constitutional:   Patient denies fever, night sweats, weight loss, and fatigue.  Skin:   Patient denies skin rash/ lesion and itching.  Eyes:   Patient denies blurred vision and double vision.  Ears/ Nose/ Throat:   Patient denies sore throat and sinus problems.  Hematologic/Lymphatic:   Patient denies swollen glands and easy bruising.  Cardiovascular:   Patient denies leg swelling and chest pains.  Respiratory:   Patient denies cough and shortness of breath.  Endocrine:   Patient denies excessive thirst.  Musculoskeletal:   Patient denies back pain and joint pain.  Neurological:   Patient denies headaches and dizziness.  Psychologic:   Patient denies anxiety and depression.   VITAL SIGNS:      08/30/2021 10:52 AM 08/30/2021 10:38 AM  Weight   185 lb / 83.91 kg  Height   62 in / 157.48 cm  BP 69/49 mmHg 78/50 mmHg  Pulse 112 /min 123 /min  Temperature   99.3 F / 37.3 C  BMI 33.8 kg/m   MULTI-SYSTEM PHYSICAL EXAMINATION:    Constitutional: Well-nourished. No physical deformities. Normally developed. Good grooming.  Respiratory: No labored breathing, no use of accessory muscles.   Cardiovascular: Regular, tachycardic  Gastrointestinal: No mass, no tenderness, no rigidity, non obese abdomen. No CVA tenderness.     Complexity of Data:  Records Review:   Previous Patient Records  X-Ray Review: KUB: Reviewed Films.    Notes:                     I independently reviewed her KUB x-ray. This demonstrates her bilateral ureteral stents to be in  appropriate position.   PROCEDURES:         KUB - [id number]  A single view of the abdomen is obtained.      . Patient confirmed No Neulasta OnPro Device.           Urinalysis w/Scope Dipstick Dipstick Cont'd Micro  Color: Red Bilirubin: Neg mg/dL WBC/hpf: 20 - 40/hpf  Appearance: Turbid Ketones: Trace mg/dL RBC/hpf: Packed/hpf  Specific Gravity: 1.015 Blood: 3+ ery/uL Bacteria: Mod (26-50/hpf)  pH: 8.0 Protein: 3+ mg/dL Cystals: NS (Not Seen)  Glucose: Neg mg/dL Urobilinogen: 0.2 mg/dL Casts: NS (Not Seen)    Nitrites: Positive Trichomonas: Not Present    Leukocyte Esterase: 3+ leu/uL Mucous: Not Present      Epithelial Cells: NS (Not Seen)      Yeast: NS (Not Seen)      Sperm: Not Present    Notes: microscopic not concentrated    ASSESSMENT:      ICD-10 Details  1 GU:   Renal and ureteral calculus - N20.2    PLAN:           Orders Labs Urine Culture  X-Rays: KUB  Schedule Return Visit/Planned Activity: Keep Scheduled Appointment          Document Letter(s):  Created for Patient: Clinical Summary         Notes:   1. Bilateral urolithiasis status post bilateral ureteroscopy with fever/developing sepsis: Her stents are in appropriate position. Her urine will be cultured. As it will be multiple hours before I could get her a hospital admission, I recommended that she proceed directly to the emergency department with her husband. She feels safe to do this. Considering her hypotension and tachycardia, she may be developing early sepsis. She has no evidence of obstruction and does not require urologic intervention but will need broad-spectrum antibiotic therapy and fluid hydration as well as evaluation for other potential causes for fever. I will notify Dr. Abner Greenspan of her status when he is back tomorrow.   CC: Adriana Reams, NP  Dr. Rexene Alberts

## 2021-08-30 NOTE — ED Triage Notes (Signed)
Pt reports fever, fatigue, and flank pain since yesterday. Pt reports having a stent placed 2 days ago. Pt reports urology sent her here for evaluation.

## 2021-08-30 NOTE — ED Notes (Signed)
Pt ambulated to bedside commode. She reports pain is better but not gone. She has been resting up until this point. Holds hand to head still complaining of headache.

## 2021-08-30 NOTE — ED Notes (Signed)
Patient is resting comfortably. 

## 2021-08-30 NOTE — ED Notes (Signed)
Pt called out on bell desiring to leave AMA due to wait time to get bed in hospital. 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 She reports she is still having a bad headache. RN gave her ice pack and assured her she will work on pain meds.

## 2021-08-31 ENCOUNTER — Inpatient Hospital Stay (HOSPITAL_COMMUNITY): Payer: BC Managed Care – PPO

## 2021-08-31 ENCOUNTER — Encounter (HOSPITAL_COMMUNITY): Payer: Self-pay | Admitting: Family Medicine

## 2021-08-31 DIAGNOSIS — R319 Hematuria, unspecified: Secondary | ICD-10-CM

## 2021-08-31 DIAGNOSIS — N132 Hydronephrosis with renal and ureteral calculous obstruction: Secondary | ICD-10-CM | POA: Diagnosis not present

## 2021-08-31 DIAGNOSIS — N201 Calculus of ureter: Secondary | ICD-10-CM | POA: Diagnosis not present

## 2021-08-31 DIAGNOSIS — R519 Headache, unspecified: Secondary | ICD-10-CM | POA: Diagnosis not present

## 2021-08-31 DIAGNOSIS — N179 Acute kidney failure, unspecified: Secondary | ICD-10-CM

## 2021-08-31 DIAGNOSIS — A419 Sepsis, unspecified organism: Secondary | ICD-10-CM | POA: Diagnosis not present

## 2021-08-31 DIAGNOSIS — N390 Urinary tract infection, site not specified: Secondary | ICD-10-CM

## 2021-08-31 DIAGNOSIS — E785 Hyperlipidemia, unspecified: Secondary | ICD-10-CM

## 2021-08-31 LAB — COMPREHENSIVE METABOLIC PANEL
ALT: 40 U/L (ref 0–44)
AST: 39 U/L (ref 15–41)
Albumin: 3.2 g/dL — ABNORMAL LOW (ref 3.5–5.0)
Alkaline Phosphatase: 59 U/L (ref 38–126)
Anion gap: 8 (ref 5–15)
BUN: 26 mg/dL — ABNORMAL HIGH (ref 6–20)
CO2: 24 mmol/L (ref 22–32)
Calcium: 8.9 mg/dL (ref 8.9–10.3)
Chloride: 103 mmol/L (ref 98–111)
Creatinine, Ser: 1.52 mg/dL — ABNORMAL HIGH (ref 0.44–1.00)
GFR, Estimated: 41 mL/min — ABNORMAL LOW (ref >60)
Glucose, Bld: 135 mg/dL — ABNORMAL HIGH (ref 70–99)
Potassium: 4.1 mmol/L (ref 3.5–5.1)
Sodium: 135 mmol/L (ref 135–145)
Total Bilirubin: 0.5 mg/dL (ref 0.3–1.2)
Total Protein: 6.8 g/dL (ref 6.5–8.1)

## 2021-08-31 LAB — CBC
HCT: 39.9 % (ref 36.0–46.0)
Hemoglobin: 13 g/dL (ref 12.0–15.0)
MCH: 30.9 pg (ref 26.0–34.0)
MCHC: 32.6 g/dL (ref 30.0–36.0)
MCV: 94.8 fL (ref 80.0–100.0)
Platelets: 134 10*3/uL — ABNORMAL LOW (ref 150–400)
RBC: 4.21 MIL/uL (ref 3.87–5.11)
RDW: 12.8 % (ref 11.5–15.5)
WBC: 10.5 10*3/uL (ref 4.0–10.5)
nRBC: 0 % (ref 0.0–0.2)

## 2021-08-31 LAB — HIV ANTIBODY (ROUTINE TESTING W REFLEX): HIV Screen 4th Generation wRfx: NONREACTIVE

## 2021-08-31 MED ORDER — BUTALBITAL-APAP-CAFFEINE 50-325-40 MG PO TABS
1.0000 | ORAL_TABLET | Freq: Four times a day (QID) | ORAL | Status: DC | PRN
  Administered 2021-08-31 – 2021-09-03 (×6): 1 via ORAL
  Filled 2021-08-31 (×6): qty 1

## 2021-08-31 NOTE — Progress Notes (Addendum)
[facility]  PROGRESS NOTE  Leslie Fitzgerald QIH:[id number] DOB: Jul 11, 1966 DOA: 08/30/2021 PCP: Jettie Booze, NP  Brief hospital course  56 y.o. female, with history of kidney stone, ureteral stone, headaches came to hospital from urologist office for fever, generalized body aches and headache.  Patient had bilateral ureteral stents placed on 08/28/2021 for a 7 mm left distal ureteral stone which was identified on 08/04/2021.  She also had 5 mm right lower pole stone which was dusted with lithotripsy. Patient says that she developed fever yesterday which was as high as 82 F, she also developed headache last night.  Denies dysuria, frequency of urination, complains of hematuria, denies flank pain or abdominal pain.  No nausea vomiting or diarrhea.  Denies chest pain or shortness of breath.  Denies cough.  Denies sore throat.  No neck pain or stiffness. She was seen at [facility] urology office where she was found to have abnormal UA and SBP was in 60s as per ED notes.  She was sent to ED for further evaluation for sepsis     Subjective   Patient seen and examined, still complains of headache.  Apparently she removed both her stents this morning.     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  Hyperlipidemia - Patient takes Crestor at home -Crestor on hold in the hospital due to elevated LFTs -LFTs have improved  Headache - Patient presented with  headache -No focal deficit; she has history of migraines in the past -We will obtain CT head -Start Fioricet 1 tablet p.o. every 6 hours as needed for headache.  UTI (urinary tract infection)- (present on admission) - Patient found to have abnormal UA -Started on IV Rocephin -Follow urine culture results -Urology plans to treat for 2 weeks of oral antibiotics at discharge  Ureteral stone with hydronephrosis- (present on admission) - Patient is status post bilateral ureteral stent placement -She accidentally pulled out 1 stent and then removed other stent this morning -Urology is following        Medications     busPIRone  10 mg Oral BID   citalopram  10 mg Oral Daily   clonazePAM  2 mg Oral QHS   enoxaparin (LOVENOX) injection  40 mg Subcutaneous Q24H     Data Reviewed:   CBG:  No results for input(s): GLUCAP in the last 168 hours.  SpO2: 96 %    Vitals:   08/31/21 0652 08/31/21 0700 08/31/21 0800 08/31/21 0918  BP:  (!) 134/119 (!) 141/88 108/66  Pulse:  (!) 118 (!) 107 (!) 102  Resp:  18 20   Temp: 99.4 F (37.4 C)  98.8 F (37.1 C)   TempSrc: Oral     SpO2:  97% 93% 96%  Weight:      Height:          Data Reviewed:  Basic Metabolic Panel: Recent Labs  Lab 08/30/21 1252 08/31/21 0500  NA 134* 135  K 4.0 4.1  CL 101 103  CO2 26 24  GLUCOSE 116* 135*  BUN 29* 26*  CREATININE 1.42* 1.52*  CALCIUM 8.9 8.9    CBC: Recent Labs  Lab 08/25/21 0835 08/30/21 1252 08/31/21 0500  WBC 5.0 12.2* 10.5  NEUTROABS  --  10.4*  --   HGB 15.2* 13.8 13.0  HCT 45.6 41.7 39.9  MCV 92.7 93.9 94.8  PLT 201 157 134*    LFT Recent Labs  Lab 08/30/21 1252 08/31/21 0500  AST 68* 39  ALT 54* 40  ALKPHOS 62 59  BILITOT 0.8 0.5  PROT 6.9 6.8  ALBUMIN 3.6 3.2*     Micro-urine culture obtained, result pending    Antibiotics: Anti-infectives (From admission, onward)    Start     Dose/Rate Route Frequency Ordered Stop   08/31/21 1000   cefTRIAXone (ROCEPHIN) 1 g in sodium chloride 0.9 % 100 mL IVPB        1 g 200 mL/hr over 30 Minutes Intravenous Every 24 hours 08/30/21 2106     08/30/21 1245  cefTRIAXone (ROCEPHIN) 1 g in sodium chloride 0.9 % 100 mL IVPB        1 g 200 mL/hr over 30 Minutes Intravenous  Once 08/30/21 1236 08/30/21 1448          DVT prophylaxis: Lovenox  Code Status: Full code  Family Communication: No family at bedside  CONSULTS: Urology    Objective    Physical Examination:  General-appears in no acute distress Heart-S1-S2, regular, no murmur auscultated Lungs-clear to auscultation bilaterally, no wheezing or crackles auscultated Abdomen-soft, nontender, no organomegaly Extremities-no edema in the lower extremities Neuro-alert, oriented x3, no focal deficit noted  Status is: Inpatient sepsis due to UTI;              Oswald Hillock   [facility] If 7PM-7AM, please contact night-coverage at [email or web address], Office  [phone]   08/31/2021, 9:59 AM  LOS: 1 day

## 2021-08-31 NOTE — Assessment & Plan Note (Addendum)
-   Patient presented with creatinine of 1.42 -Creatinine today has improved to 1.12 -Renal ultrasound shows worsening bilateral hydronephrosis -Urology has cleared for discharge, if hemoglobin remains stable tomorrow morning

## 2021-08-31 NOTE — Plan of Care (Signed)

## 2021-08-31 NOTE — ED Notes (Signed)
Patient is resting comfortably. 

## 2021-08-31 NOTE — Assessment & Plan Note (Signed)
-   Patient takes Crestor at home -Crestor on hold in the hospital due to elevated LFTs -LFTs have improved

## 2021-08-31 NOTE — Assessment & Plan Note (Addendum)
-   Patient presented with headache -No focal deficit; she has history of migraines in the past -CT head obtained shows partial empty sella which could be normal variant versus idiopathic intracranial hypertension -Started on  Fioricet 1 tablet p.o. every 6 hours as needed for headache. -Called and discussed with neurology, Dr. Rory Percy, who recommends treatment for migraine.  Highly unlikely that patient has idiopathic intracranial hypertension without visual symptoms -We will give 1 dose of Toradol 30 mg IV

## 2021-08-31 NOTE — ED Notes (Signed)
Turned and repositioned in bed. Purewick in place. Cannister emptied of 1075ml blood red/brown urine. No signs of distress. No n/v. Spouse at bedside.

## 2021-08-31 NOTE — Assessment & Plan Note (Addendum)
-   presented with fever, tachycardia -Was found to have abnormal UA -She was hypotensive on presentation, started on 30 cc/kg IV fluid bolus, which was changed to LR at 100 mL/h -Sepsis physiology has resolved -Patient empirically started on IV Rocephin; IV Rocephin was discontinued patient started on amoxicillin to cover for Enterococcus

## 2021-08-31 NOTE — Hospital Course (Signed)
56 y.o. female, with history of kidney stone, ureteral stone, headaches came to hospital from urologist office for fever, generalized body aches and headache.  Patient had bilateral ureteral stents placed on 08/28/2021 for a 7 mm left distal ureteral stone which was identified on 08/04/2021.  She also had 5 mm right lower pole stone which was dusted with lithotripsy. Patient says that she developed fever yesterday which was as high as 67 F, she also developed headache last night.  Denies dysuria, frequency of urination, complains of hematuria, denies flank pain or abdominal pain.  No nausea vomiting or diarrhea.  Denies chest pain or shortness of breath.  Denies cough.  Denies sore throat.  No neck pain or stiffness. She was seen at [facility] urology office where she was found to have abnormal UA and SBP was in 60s as per ED notes.  She was sent to ED for further evaluation for sepsis

## 2021-08-31 NOTE — Assessment & Plan Note (Addendum)
-   Patient is status post bilateral ureteral stent placement -She accidentally pulled out 1 stent and then removed other stent on 08/31/2021 -Renal ultrasound shows worsening bilateral hydronephrosist -Urology has cleared her for discharge

## 2021-08-31 NOTE — ED Notes (Signed)
Pt up and using bedside commode and had BM. Back in bed resting.

## 2021-08-31 NOTE — Progress Notes (Signed)
Subjective: C/o headache. Denies abdominal pain or flank pain. Afebrile since presentation, low grade temp this AM. She states she accidentally pulled out one of her stents this AM and then removed the other stent. Voiding.  Objective: Vital signs in last 24 hours: Temp:  [98.2 F (36.8 C)-101.4 F (38.6 C)] 99.4 F (37.4 C) (02/16 0652) Pulse Rate:  [104-123] 118 (02/16 0700) Resp:  [16-18] 18 (02/16 0700) BP: (105-134)/(62-119) 134/119 (02/16 0700) SpO2:  [89 %-100 %] 97 % (02/16 0700) Weight:  [86.5 kg] 86.5 kg (02/15 2240)  Intake/Output from previous day: 02/15 0701 - 02/16 0700 In: 1206.3 [IV Piggyback:1206.3] Out: -  Intake/Output this shift: No intake/output data recorded.  Physical Exam:  General: Alert and oriented CV: RRR Lungs: Clear Abdomen: Soft, ND, NT, No CVA tenderness Ext: NT, No erythema  Lab Results: Recent Labs    08/30/21 1252 08/31/21 0500  HGB 13.8 13.0  HCT 41.7 39.9   BMET Recent Labs    08/30/21 1252 08/31/21 0500  NA 134* 135  K 4.0 4.1  CL 101 103  CO2 26 24  GLUCOSE 116* 135*  BUN 29* 26*  CREATININE 1.42* 1.52*  CALCIUM 8.9 8.9     Studies/Results: DG Abdomen 1 View  Result Date: 08/30/2021 CLINICAL DATA:  Assess stent placement. EXAM: ABDOMEN - 1 VIEW COMPARISON:  Abdominal x-ray from same day. Intraoperative x-rays dated August 28, 2021. FINDINGS: Bilateral double-J ureteral stents are appropriately positioned. No visible renal calculi by x-ray. Unchanged phleboliths in the pelvis. The bowel gas pattern is normal. IMPRESSION: 1. Bilateral double-J ureteral stents are appropriately positioned. Electronically Signed   By: Titus Dubin M.D.   On: 08/30/2021 13:58   US RENAL  Result Date: 08/30/2021 CLINICAL DATA:  Bilateral flank pain. Recent ureteral stent placement. EXAM: RENAL / URINARY TRACT ULTRASOUND COMPLETE COMPARISON:  Radiographs 08/30/2021, CT 08/08/2021 and renal ultrasound 07/24/2012. FINDINGS: Right  Kidney: Renal measurements: 13.2 x 7.1 x 6.8 cm = volume: 336.1 mL. Multiple nonobstructing renal calculi are demonstrated. Mild prominence of the renal pelvis without hydronephrosis. The ureteral stent is not visualized within the right renal pelvis. Left Kidney: Renal measurements: 11.5 x 5.0 x 5.3 cm = volume: 159.4 mL. Multiple nonobstructing renal calculi are demonstrated. There is diffuse cortical thinning and scarring. Mild prominence of the renal pelvis without hydronephrosis. The ureteral stent is not visualized within the left renal pelvis. Bladder: Partially decompressed, likely accounting for mild bladder wall thickening. The distal aspects of the ureteral stents are visualized. Other: Small renal cysts are noted. IMPRESSION: 1. Mild prominence of both renal pelves following ureteral stent placement. No hydronephrosis. 2. Nonobstructing bilateral renal calculi with left renal cortical scarring. Electronically Signed   By: Richardean Sale M.D.   On: 08/30/2021 13:49   DG Chest Port 1 View  Result Date: 08/30/2021 CLINICAL DATA:  Fever and fatigue since yesterday. Recent ureteral stent placement 2 days ago. EXAM: PORTABLE CHEST 1 VIEW COMPARISON:  None. FINDINGS: The heart size and mediastinal contours are within normal limits. Both lungs are clear. The visualized skeletal structures are unremarkable. IMPRESSION: No active disease. Electronically Signed   By: Titus Dubin M.D.   On: 08/30/2021 12:49    Assessment/Plan: SIRS/UTI s/p B/L URS/LL   -KUB and RUS with stents in appropriate position yesterday. She did remove both stents this AM. -Cr 1.5. 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 Also in setting of infection. -Blood cx  prelim no growth -F/u urine culture data -Continue broad spectrum abx -Will require 2 weeks PO abx upon dischage. -Remain hospitalized today   LOS: 1 day   Matt R. Cyndi Montejano MD 08/31/2021, 7:56  AM [facility]  Pager: [phone]

## 2021-08-31 NOTE — Assessment & Plan Note (Addendum)
-   Patient found to have abnormal UA -Started on IV Rocephin -Urine culture grew Enterococcus faecalis; IV Rocephin changed to p.o. amoxicillin -Urology plans to treat for 2 weeks of oral antibiotics at discharge

## 2021-09-01 ENCOUNTER — Inpatient Hospital Stay (HOSPITAL_COMMUNITY): Payer: BC Managed Care – PPO

## 2021-09-01 DIAGNOSIS — A419 Sepsis, unspecified organism: Secondary | ICD-10-CM | POA: Diagnosis not present

## 2021-09-01 DIAGNOSIS — N201 Calculus of ureter: Secondary | ICD-10-CM | POA: Diagnosis not present

## 2021-09-01 DIAGNOSIS — N179 Acute kidney failure, unspecified: Secondary | ICD-10-CM | POA: Diagnosis not present

## 2021-09-01 DIAGNOSIS — N132 Hydronephrosis with renal and ureteral calculous obstruction: Secondary | ICD-10-CM | POA: Diagnosis not present

## 2021-09-01 LAB — CBC WITH DIFFERENTIAL/PLATELET
Abs Immature Granulocytes: 0.02 10*3/uL (ref 0.00–0.07)
Basophils Absolute: 0 10*3/uL (ref 0.0–0.1)
Basophils Relative: 0 %
Eosinophils Absolute: 0 10*3/uL (ref 0.0–0.5)
Eosinophils Relative: 0 %
HCT: 36.9 % (ref 36.0–46.0)
Hemoglobin: 12.2 g/dL (ref 12.0–15.0)
Immature Granulocytes: 0 %
Lymphocytes Relative: 10 %
Lymphs Abs: 0.7 10*3/uL (ref 0.7–4.0)
MCH: 31 pg (ref 26.0–34.0)
MCHC: 33.1 g/dL (ref 30.0–36.0)
MCV: 93.9 fL (ref 80.0–100.0)
Monocytes Absolute: 0.8 10*3/uL (ref 0.1–1.0)
Monocytes Relative: 11 %
Neutro Abs: 5.7 10*3/uL (ref 1.7–7.7)
Neutrophils Relative %: 79 %
Platelets: 126 10*3/uL — ABNORMAL LOW (ref 150–400)
RBC: 3.93 MIL/uL (ref 3.87–5.11)
RDW: 12.6 % (ref 11.5–15.5)
WBC: 7.3 10*3/uL (ref 4.0–10.5)
nRBC: 0 % (ref 0.0–0.2)

## 2021-09-01 LAB — BASIC METABOLIC PANEL
Anion gap: 7 (ref 5–15)
BUN: 25 mg/dL — ABNORMAL HIGH (ref 6–20)
CO2: 25 mmol/L (ref 22–32)
Calcium: 9 mg/dL (ref 8.9–10.3)
Chloride: 103 mmol/L (ref 98–111)
Creatinine, Ser: 1.69 mg/dL — ABNORMAL HIGH (ref 0.44–1.00)
GFR, Estimated: 36 mL/min — ABNORMAL LOW (ref >60)
Glucose, Bld: 118 mg/dL — ABNORMAL HIGH (ref 70–99)
Potassium: 4 mmol/L (ref 3.5–5.1)
Sodium: 135 mmol/L (ref 135–145)

## 2021-09-01 MED ORDER — MELATONIN 5 MG PO TABS
5.0000 mg | ORAL_TABLET | Freq: Once | ORAL | Status: AC
  Administered 2021-09-01: 5 mg via ORAL
  Filled 2021-09-01: qty 1

## 2021-09-01 MED ORDER — SODIUM CHLORIDE 0.45 % IV SOLN: INTRAVENOUS | Status: DC

## 2021-09-01 MED ORDER — AMOXICILLIN 250 MG PO CAPS
500.0000 mg | ORAL_CAPSULE | Freq: Three times a day (TID) | ORAL | Status: DC
  Administered 2021-09-01 – 2021-09-03 (×6): 500 mg via ORAL
  Filled 2021-09-01 (×7): qty 2

## 2021-09-01 NOTE — Progress Notes (Addendum)
Subjective: C/o headache. No sharp abdominal or flank pain. She does complain of persistent incomplete emptying and bladder pressure. Afebrile. Voiding via purewick thin red. No nausea or emesis.  Objective: Vital signs in last 24 hours: Temp:  [97.5 F (36.4 C)-98.8 F (37.1 C)] 97.5 F (36.4 C) (02/17 0531) Pulse Rate:  [86-110] 100 (02/17 0531) Resp:  [12-20] 20 (02/17 0531) BP: (113-143)/(68-96) 137/96 (02/17 0531) SpO2:  [92 %-97 %] 92 % (02/17 0531) Weight:  [88 kg] 88 kg (02/16 2115)  Intake/Output from previous day: 02/16 0701 - 02/17 0700 In: 1312.4 [P.O.:240; I.V.:1000; IV Piggyback:72.4] Out: 1000 [Urine:1000] Intake/Output this shift: No intake/output data recorded.  Multiple unrecorded voids  Physical Exam:  General: Alert and oriented CV: RRR Lungs: Clear Abdomen: Soft, ND, NT, no CVA tenderness Ext: NT, No erythema  Lab Results: Recent Labs    08/30/21 1252 08/31/21 0500 09/01/21 0440  HGB 13.8 13.0 12.2  HCT 41.7 39.9 36.9   BMET Recent Labs    08/31/21 0500 09/01/21 0441  NA 135 135  K 4.1 4.0  CL 103 103  CO2 24 25  GLUCOSE 135* 118*  BUN 26* 25*  CREATININE 1.52* 1.69*  CALCIUM 8.9 9.0     Studies/Results: DG Abdomen 1 View  Result Date: 08/30/2021 CLINICAL DATA:  Assess stent placement. EXAM: ABDOMEN - 1 VIEW COMPARISON:  Abdominal x-ray from same day. Intraoperative x-rays dated August 28, 2021. FINDINGS: Bilateral double-J ureteral stents are appropriately positioned. No visible renal calculi by x-ray. Unchanged phleboliths in the pelvis. The bowel gas pattern is normal. IMPRESSION: 1. Bilateral double-J ureteral stents are appropriately positioned. Electronically Signed   By: Titus Dubin M.D.   On: 08/30/2021 13:58   CT HEAD WO CONTRAST (5MM)  Result Date: 08/31/2021 CLINICAL DATA:  Headache, new or worsening (Age >= 50y) EXAM: CT HEAD WITHOUT CONTRAST TECHNIQUE: Contiguous axial images were obtained from the base of the  skull through the vertex without intravenous contrast. RADIATION DOSE REDUCTION: This exam was performed according to the departmental dose-optimization program which includes automated exposure control, adjustment of the mA and/or kV according to patient size and/or use of iterative reconstruction technique. COMPARISON:  None. FINDINGS: Brain: No evidence of acute infarction, hemorrhage, hydrocephalus, extra-axial collection or mass lesion/mass effect. 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 Partially empty sella. Vascular: No hyperdense vessel identified. Skull: No acute fracture. Sinuses/Orbits: Sinuses are largely clear. Unremarkable visualized orbits. Other: No mastoid effusions. IMPRESSION: 1. No evidence of acute intracranial abnormality. 2. Partially empty sella, which is often a normal anatomic variant but can be associated with idiopathic intracranial hypertension. Electronically Signed   By: Margaretha Sheffield M.D.   On: 08/31/2021 09:53   US RENAL  Result Date: 09/01/2021 CLINICAL DATA:  Acute kidney injury. EXAM: RENAL / URINARY TRACT ULTRASOUND COMPLETE COMPARISON:  August 30, 2021. FINDINGS: Right Kidney: Renal measurements: 13.9 x 8.9 x 7.3 cm = volume: 455 mL. Echogenicity within normal limits. No mass visualized. Moderate hydronephrosis is now noted which is increased compared to prior exam. Left Kidney: Renal measurements: 11.1 x 6.7 x 6.9 cm = volume: 279 mL. Echogenicity within normal limits. No mass visualized. Moderate hydronephrosis is noted which is increased compared to prior exam Bladder: Appears normal for degree of bladder distention. Other: None. IMPRESSION: Moderate bilateral hydronephrosis is noted which is significantly increased compared to prior exam. Electronically Signed   By: Marijo Conception M.D.   On: 09/01/2021 10:32  US RENAL  Result Date: 08/30/2021 CLINICAL DATA:  Bilateral flank pain. Recent  ureteral stent placement. EXAM: RENAL / URINARY TRACT ULTRASOUND COMPLETE COMPARISON:  Radiographs 08/30/2021, CT 08/08/2021 and renal ultrasound 07/24/2012. FINDINGS: Right Kidney: Renal measurements: 13.2 x 7.1 x 6.8 cm = volume: 336.1 mL. Multiple nonobstructing renal calculi are demonstrated. Mild prominence of the renal pelvis without hydronephrosis. The ureteral stent is not visualized within the right renal pelvis. Left Kidney: Renal measurements: 11.5 x 5.0 x 5.3 cm = volume: 159.4 mL. Multiple nonobstructing renal calculi are demonstrated. There is diffuse cortical thinning and scarring. Mild prominence of the renal pelvis without hydronephrosis. The ureteral stent is not visualized within the left renal pelvis. Bladder: Partially decompressed, likely accounting for mild bladder wall thickening. The distal aspects of the ureteral stents are visualized. Other: Small renal cysts are noted. IMPRESSION: 1. Mild prominence of both renal pelves following ureteral stent placement. No hydronephrosis. 2. Nonobstructing bilateral renal calculi with left renal cortical scarring. Electronically Signed   By: Richardean Sale M.D.   On: 08/30/2021 13:49   DG Chest Port 1 View  Result Date: 08/30/2021 CLINICAL DATA:  Fever and fatigue since yesterday. Recent ureteral stent placement 2 days ago. EXAM: PORTABLE CHEST 1 VIEW COMPARISON:  None. FINDINGS: The heart size and mediastinal contours are within normal limits. Both lungs are clear. The visualized skeletal structures are unremarkable. IMPRESSION: No active disease. Electronically Signed   By: Titus Dubin M.D.   On: 08/30/2021 12:49    Assessment/Plan: UTI after bilateral ureteroscopy/laser lithotripsy on 08/28/2021 History of urolithiasis. 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 -In absence of fevers, abdominal pain, I do not think that she requires bilateral ureteral stents now as this will likely lead to more morbidity, bleeding, discomfort and more anesthesia risk -Creatinine is slightly rising however electrolytes are normal and she is having excellent urine output -Continue MIVF, antibiotics.  -Trend creatinine. If significantly elevated tomorrow, may require non-con CT abd -Trend hemoglobin -UCX in office on 2/15 no growth -F/u urine culture -Discussed with nursing to bladder scan after void and if significantly elevated to place foley -Ok for diet today -NPO after midnight in case of necessitating stents tomorrow -Discussed plan with both patient and husband    LOS: 2 days   Matt R. Devona Holmes MD 09/01/2021, 11:11 AM [facility]  Pager: [phone]

## 2021-09-01 NOTE — Progress Notes (Signed)
[facility]  PROGRESS NOTE  Leslie Fitzgerald KGM:[id number] DOB: March 03, 1966 DOA: 08/30/2021 PCP: Jettie Booze, NP  Brief hospital course  56 y.o. female, with history of kidney stone, ureteral stone, headaches came to hospital from urologist office for fever, generalized body aches and headache.  Patient had bilateral ureteral stents placed on 08/28/2021 for a 7 mm left distal ureteral stone which was identified on 08/04/2021.  She also had 5 mm right lower pole stone which was dusted with lithotripsy. Patient says that she developed fever yesterday which was as high as 58 F, she also developed headache last night.  Denies dysuria, frequency of urination, complains of hematuria, denies flank pain or abdominal pain.  No nausea vomiting or diarrhea.  Denies chest pain or shortness of breath.  Denies cough.  Denies sore throat.  No neck pain or stiffness. She was seen at [facility] urology office where she was found to have abnormal UA and SBP was in 60s as per ED notes.  She was sent to ED for further evaluation for sepsis     Subjective   Patient seen and examined, headache is improved with Fioricet.  CT head showed partially empty sella which could be normal variant versus idiopathic intracranial hypertension.  Urology has ordered repeat ultrasound to check for hydronephrosis     Assessment and Plan:  * Sepsis ([place])- (present on admission) - presented with fever, tachycardia -Was found to have abnormal UA -She was hypotensive on presentation, started on 30 cc/kg IV fluid bolus, which was changed to LR at 100 mL/h -Sepsis physiology has resolved -Patient empirically started on IV Rocephin  AKI (acute kidney injury) ([place]) - Patient presented with creatinine of 1.42 -Creatinine jumped to 1.69 this morning  -Renal ultrasound shows worsening bilateral hydronephrosis -We will likely need another stent placement -Urology following   Hyperlipidemia - Patient takes Crestor at  home -Crestor on hold in the hospital due to elevated LFTs -LFTs have improved  Headache - Patient presented with headache -No focal deficit; she has history of migraines in the past -CT head obtained shows partial empty sella which could be normal variant versus idiopathic intracranial hypertension -Started on  Fioricet 1 tablet p.o. every 6 hours as needed for headache. -Called and discussed with neurology, Dr. Rory Percy, who recommends treatment for migraine.  Highly unlikely that patient has idiopathic intracranial hypertension without visual symptoms  UTI (urinary tract infection)- (present on admission) - Patient found to have abnormal UA -Started on IV Rocephin -Follow urine culture results -Urology plans to treat for 2 weeks of oral antibiotics at discharge  Ureteral stone with hydronephrosis- (present on admission) - Patient is status post bilateral ureteral stent placement -She accidentally pulled out 1 stent and then removed other stent on 08/31/2021 -Renal ultrasound shows worsening bilateral hydronephrosis -She will likely need another stent placement -Urology is following        Medications     amoxicillin  500 mg Oral Q8H   busPIRone  10 mg Oral BID   citalopram  10 mg Oral Daily   clonazePAM  2 mg Oral QHS     Data Reviewed:   CBG:  No results for input(s): GLUCAP in the last 168 hours.  SpO2: 92 %    Vitals:   08/31/21 2030 08/31/21 2115 09/01/21 0115 09/01/21 0531  BP: 123/68 129/71 124/71 (!) 137/96  Pulse: 94 86 98 100  Resp: 13 18 18 20   Temp:  98.4 F (36.9 C) 98.8 F (37.1 C) (!)  97.5 F (36.4 C)  TempSrc:  Oral Oral Oral  SpO2: 95% 93% 97% 92%  Weight:  88 kg    Height:  5\' 2"  (1.575 m)        Data Reviewed:  Basic Metabolic Panel: Recent Labs  Lab 08/30/21 1252 08/31/21 0500 09/01/21 0441  NA 134* 135 135  K 4.0 4.1 4.0  CL 101 103 103  CO2 26 24 25   GLUCOSE 116* 135* 118*  BUN 29* 26* 25*  CREATININE 1.42* 1.52* 1.69*   CALCIUM 8.9 8.9 9.0    CBC: Recent Labs  Lab 08/30/21 1252 08/31/21 0500 09/01/21 0440  WBC 12.2* 10.5 7.3  NEUTROABS 10.4*  --  5.7  HGB 13.8 13.0 12.2  HCT 41.7 39.9 36.9  MCV 93.9 94.8 93.9  PLT 157 134* 126*    LFT Recent Labs  Lab 08/30/21 1252 08/31/21 0500  AST 68* 39  ALT 54* 40  ALKPHOS 62 59  BILITOT 0.8 0.5  PROT 6.9 6.8  ALBUMIN 3.6 3.2*     Micro-urine culture obtained, result pending    Antibiotics: Anti-infectives (From admission, onward)    Start     Dose/Rate Route Frequency Ordered Stop   09/01/21 1400  amoxicillin (AMOXIL) capsule 500 mg        500 mg Oral Every 8 hours 09/01/21 1157     08/31/21 1000  cefTRIAXone (ROCEPHIN) 1 g in sodium chloride 0.9 % 100 mL IVPB  Status:  Discontinued        1 g 200 mL/hr over 30 Minutes Intravenous Every 24 hours 08/30/21 2106 09/01/21 1157   08/30/21 1245  cefTRIAXone (ROCEPHIN) 1 g in sodium chloride 0.9 % 100 mL IVPB        1 g 200 mL/hr over 30 Minutes Intravenous  Once 08/30/21 1236 08/30/21 1448          DVT prophylaxis: Lovenox  Code Status: Full code  Family Communication: No family at bedside  CONSULTS: Urology    Objective    Physical Examination:  General-appears in no acute distress Heart-S1-S2, regular, no murmur auscultated Lungs-clear to auscultation bilaterally, no wheezing or crackles auscultated Abdomen-soft, nontender, no organomegaly Extremities-no edema in the lower extremities Neuro-alert, oriented x3, no focal deficit noted  Status is: Inpatient sepsis due to UTI;  [facility] If 7PM-7AM, please contact night-coverage at [email or web address], Office  [phone]   09/01/2021, 2:52 PM  LOS: 2 days

## 2021-09-01 NOTE — Plan of Care (Signed)
  Problem: Education: Goal: Knowledge of General Education information will improve Description Including pain rating scale, medication(s)/side effects and non-pharmacologic comfort measures Outcome: Progressing   Problem: Health Behavior/Discharge Planning: Goal: Ability to manage health-related needs will improve Outcome: Progressing   

## 2021-09-01 NOTE — Progress Notes (Addendum)
Subjective: Headache improved. Denies abdominal pain or flank pain. Has had sensation of incomplete bladder emptying. No PVRs obtained. 1L UOP dark. No nausea or emesis.  Objective: Vital signs in last 24 hours: Temp:  [97.5 F (36.4 C)-98.8 F (37.1 C)] 97.5 F (36.4 C) (02/17 0531) Pulse Rate:  [86-110] 100 (02/17 0531) Resp:  [12-20] 20 (02/17 0531) BP: (108-143)/(66-96) 137/96 (02/17 0531) SpO2:  [92 %-97 %] 92 % (02/17 0531) Weight:  [88 kg] 88 kg (02/16 2115)  Intake/Output from previous day: 02/16 0701 - 02/17 0700 In: 1312.4 [P.O.:240; I.V.:1000; IV Piggyback:72.4] Out: 1000 [Urine:1000] Intake/Output this shift: No intake/output data recorded. UOP: 1L thin red, no clots  Physical Exam:  General: Alert and oriented CV: RRR Lungs: Clear Abdomen: Soft, ND, NT; no CVA tenderness Ext: NT, No erythema  Lab Results: Recent Labs    08/30/21 1252 08/31/21 0500  HGB 13.8 13.0  HCT 41.7 39.9   BMET Recent Labs    08/31/21 0500 09/01/21 0441  NA 135 135  K 4.1 4.0  CL 103 103  CO2 24 25  GLUCOSE 135* 118*  BUN 26* 25*  CREATININE 1.52* 1.69*  CALCIUM 8.9 9.0     Studies/Results: DG Abdomen 1 View  Result Date: 08/30/2021 CLINICAL DATA:  Assess stent placement. EXAM: ABDOMEN - 1 VIEW COMPARISON:  Abdominal x-ray from same day. Intraoperative x-rays dated August 28, 2021. FINDINGS: Bilateral double-J ureteral stents are appropriately positioned. No visible renal calculi by x-ray. Unchanged phleboliths in the pelvis. The bowel gas pattern is normal. IMPRESSION: 1. Bilateral double-J ureteral stents are appropriately positioned. Electronically Signed   By: Titus Dubin M.D.   On: 08/30/2021 13:58   CT HEAD WO CONTRAST (5MM)  Result Date: 08/31/2021 CLINICAL DATA:  Headache, new or worsening (Age >= 50y) EXAM: CT HEAD WITHOUT CONTRAST TECHNIQUE: Contiguous axial images were obtained from the base of the skull through the vertex without intravenous  contrast. RADIATION DOSE REDUCTION: This exam was performed according to the departmental dose-optimization program which includes automated exposure control, adjustment of the mA and/or kV according to patient size and/or use of iterative reconstruction technique. COMPARISON:  None. FINDINGS: Brain: No evidence of acute infarction, hemorrhage, hydrocephalus, extra-axial collection or mass lesion/mass effect. 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 Partially empty sella. Vascular: No hyperdense vessel identified. Skull: No acute fracture. Sinuses/Orbits: Sinuses are largely clear. Unremarkable visualized orbits. Other: No mastoid effusions. IMPRESSION: 1. No evidence of acute intracranial abnormality. 2. Partially empty sella, which is often a normal anatomic variant but can be associated with idiopathic intracranial hypertension. Electronically Signed   By: Margaretha Sheffield M.D.   On: 08/31/2021 09:53   US RENAL  Result Date: 08/30/2021 CLINICAL DATA:  Bilateral flank pain. Recent ureteral stent placement. EXAM: RENAL / URINARY TRACT ULTRASOUND COMPLETE COMPARISON:  Radiographs 08/30/2021, CT 08/08/2021 and renal ultrasound 07/24/2012. FINDINGS: Right Kidney: Renal measurements: 13.2 x 7.1 x 6.8 cm = volume: 336.1 mL. Multiple nonobstructing renal calculi are demonstrated. Mild prominence of the renal pelvis without hydronephrosis. The ureteral stent is not visualized within the right renal pelvis. Left Kidney: Renal measurements: 11.5 x 5.0 x 5.3 cm = volume: 159.4 mL. Multiple nonobstructing renal calculi are demonstrated. There is diffuse cortical thinning and scarring. Mild prominence of the renal pelvis without hydronephrosis. The ureteral stent is not visualized within the left renal pelvis. Bladder: Partially decompressed, likely accounting for mild bladder wall thickening. The distal aspects of the ureteral stents  are visualized.  Other: Small renal cysts are noted. IMPRESSION: 1. Mild prominence of both renal pelves following ureteral stent placement. No hydronephrosis. 2. Nonobstructing bilateral renal calculi with left renal cortical scarring. Electronically Signed   By: Richardean Sale M.D.   On: 08/30/2021 13:49   DG Chest Port 1 View  Result Date: 08/30/2021 CLINICAL DATA:  Fever and fatigue since yesterday. Recent ureteral stent placement 2 days ago. EXAM: PORTABLE CHEST 1 VIEW COMPARISON:  None. FINDINGS: The heart size and mediastinal contours are within normal limits. Both lungs are clear. The visualized skeletal structures are unremarkable. IMPRESSION: No active disease. Electronically Signed   By: Titus Dubin M.D.   On: 08/30/2021 12:49    Assessment/Plan: UTI after bilateral ureteroscopy/laser lithotripsy on 08/28/2021 Gross hematuria   -She accidentally removed both stents yesterday AM -Remains afebrile. No leukocytosis -Creatinine 1.7. -Will check RBUS to ensure no hydro -Check PVRs -NPO for now in case needs stents -She is having persistent gross hematuria in setting of recent bilateral ureteroscopy which should resolve. Hgb today 12.2. Of note, she has new thrombocytopenia with plt 126. PT/INR on 2/15 was normal. -Blood cx no growth -Ucx pending -Continue ceftriaxone   LOS: 2 days   Matt R. Istvan Behar MD 09/01/2021, 7:37 AM [facility]  Pager: [phone]

## 2021-09-02 DIAGNOSIS — N201 Calculus of ureter: Secondary | ICD-10-CM | POA: Diagnosis not present

## 2021-09-02 DIAGNOSIS — E876 Hypokalemia: Secondary | ICD-10-CM

## 2021-09-02 DIAGNOSIS — N179 Acute kidney failure, unspecified: Secondary | ICD-10-CM | POA: Diagnosis not present

## 2021-09-02 DIAGNOSIS — R109 Unspecified abdominal pain: Secondary | ICD-10-CM

## 2021-09-02 DIAGNOSIS — A419 Sepsis, unspecified organism: Secondary | ICD-10-CM | POA: Diagnosis not present

## 2021-09-02 LAB — CBC WITH DIFFERENTIAL/PLATELET
Abs Immature Granulocytes: 0.03 10*3/uL (ref 0.00–0.07)
Basophils Absolute: 0 10*3/uL (ref 0.0–0.1)
Basophils Relative: 1 %
Eosinophils Absolute: 0.2 10*3/uL (ref 0.0–0.5)
Eosinophils Relative: 4 %
HCT: 33.1 % — ABNORMAL LOW (ref 36.0–46.0)
Hemoglobin: 10.9 g/dL — ABNORMAL LOW (ref 12.0–15.0)
Immature Granulocytes: 1 %
Lymphocytes Relative: 19 %
Lymphs Abs: 1.2 10*3/uL (ref 0.7–4.0)
MCH: 30.6 pg (ref 26.0–34.0)
MCHC: 32.9 g/dL (ref 30.0–36.0)
MCV: 93 fL (ref 80.0–100.0)
Monocytes Absolute: 1 10*3/uL (ref 0.1–1.0)
Monocytes Relative: 15 %
Neutro Abs: 3.9 10*3/uL (ref 1.7–7.7)
Neutrophils Relative %: 60 %
Platelets: 164 10*3/uL (ref 150–400)
RBC: 3.56 MIL/uL — ABNORMAL LOW (ref 3.87–5.11)
RDW: 12.5 % (ref 11.5–15.5)
WBC: 6.3 10*3/uL (ref 4.0–10.5)
nRBC: 0 % (ref 0.0–0.2)

## 2021-09-02 LAB — URINE CULTURE: Culture: 1000 — AB

## 2021-09-02 LAB — BASIC METABOLIC PANEL
Anion gap: 9 (ref 5–15)
BUN: 20 mg/dL (ref 6–20)
CO2: 23 mmol/L (ref 22–32)
Calcium: 8.7 mg/dL — ABNORMAL LOW (ref 8.9–10.3)
Chloride: 102 mmol/L (ref 98–111)
Creatinine, Ser: 1.12 mg/dL — ABNORMAL HIGH (ref 0.44–1.00)
GFR, Estimated: 58 mL/min — ABNORMAL LOW (ref >60)
Glucose, Bld: 102 mg/dL — ABNORMAL HIGH (ref 70–99)
Potassium: 3.1 mmol/L — ABNORMAL LOW (ref 3.5–5.1)
Sodium: 134 mmol/L — ABNORMAL LOW (ref 135–145)

## 2021-09-02 MED ORDER — POTASSIUM CHLORIDE CRYS ER 20 MEQ PO TBCR
40.0000 meq | EXTENDED_RELEASE_TABLET | ORAL | Status: AC
  Administered 2021-09-02 (×3): 40 meq via ORAL
  Filled 2021-09-02 (×3): qty 2

## 2021-09-02 MED ORDER — HYDROCORTISONE 1 % EX OINT
TOPICAL_OINTMENT | Freq: Three times a day (TID) | CUTANEOUS | Status: DC
  Administered 2021-09-02: 1 via TOPICAL
  Filled 2021-09-02: qty 28.35

## 2021-09-02 MED ORDER — KETOROLAC TROMETHAMINE 30 MG/ML IJ SOLN
30.0000 mg | Freq: Once | INTRAMUSCULAR | Status: AC
  Administered 2021-09-02: 30 mg via INTRAVENOUS
  Filled 2021-09-02: qty 1

## 2021-09-02 NOTE — Assessment & Plan Note (Signed)
-   Potassium was 3.1 this morning -Replace potassium and follow BMP in am

## 2021-09-02 NOTE — Plan of Care (Signed)
?  Problem: Clinical Measurements: ?Goal: Ability to maintain clinical measurements within normal limits will improve ?Outcome: Progressing ?Goal: Will remain free from infection ?Outcome: Progressing ?Goal: Diagnostic test results will improve ?Outcome: Progressing ?  ?

## 2021-09-02 NOTE — Progress Notes (Signed)
[facility]  PROGRESS NOTE  Leslie Fitzgerald BSJ:[id number] DOB: 10/25/1965 DOA: 08/30/2021 PCP: Jettie Booze, NP  Brief hospital course  56 y.o. female, with history of kidney stone, ureteral stone, headaches came to hospital from urologist office for fever, generalized body aches and headache.  Patient had bilateral ureteral stents placed on 08/28/2021 for a 7 mm left distal ureteral stone which was identified on 08/04/2021.  She also had 5 mm right lower pole stone which was dusted with lithotripsy. Patient says that she developed fever yesterday which was as high as 47 F, she also developed headache last night.  Denies dysuria, frequency of urination, complains of hematuria, denies flank pain or abdominal pain.  No nausea vomiting or diarrhea.  Denies chest pain or shortness of breath.  Denies cough.  Denies sore throat.  No neck pain or stiffness. She was seen at [facility] urology office where she was found to have abnormal UA and SBP was in 60s as per ED notes.  She was sent to ED for further evaluation for sepsis     Subjective   Patient seen and examined, feeling much better today.  Headache is resolved, only getting intermittent sharp pain at forehead above eyebrows on both sides.     Assessment and Plan:  * Sepsis ([place])- (present on admission) - presented with fever, tachycardia -Was found to have abnormal UA -She was hypotensive on presentation, started on 30 cc/kg IV fluid bolus, which was changed to LR at 100 mL/h -Sepsis physiology has resolved -Patient empirically started on IV Rocephin; IV Rocephin was discontinued patient started on amoxicillin to cover for Enterococcus  Hypokalemia - Potassium was 3.1 this morning -Replace potassium and follow BMP in am  AKI (acute kidney injury) ([place]) - Patient presented with creatinine of 1.42 -Creatinine today has improved to 1.12 -Renal ultrasound shows worsening bilateral hydronephrosis -Urology has cleared for  discharge, if hemoglobin remains stable tomorrow morning   Hyperlipidemia - Patient takes Crestor at home -Crestor on hold in the hospital due to elevated LFTs -LFTs have improved  Headache - Patient presented with headache -No focal deficit; she has history of migraines in the past -CT head obtained shows partial empty sella which could be normal variant versus idiopathic intracranial hypertension -Started on  Fioricet 1 tablet p.o. every 6 hours as needed for headache. -Called and discussed with neurology, Dr. Rory Percy, who recommends treatment for migraine.  Highly unlikely that patient has idiopathic intracranial hypertension without visual symptoms -We will give 1 dose of Toradol 30 mg IV   UTI (urinary tract infection)- (present on admission) - Patient found to have abnormal UA -Started on IV Rocephin -Urine culture grew Enterococcus faecalis; IV Rocephin changed to p.o. amoxicillin -Urology plans to treat for 2 weeks of oral antibiotics at discharge  Ureteral stone with hydronephrosis- (present on admission) - Patient is status post bilateral ureteral stent placement -She accidentally pulled out 1 stent and then removed other stent on 08/31/2021 -Renal ultrasound shows worsening bilateral hydronephrosist -Urology has cleared her for discharge        Medications     amoxicillin  500 mg Oral Q8H   busPIRone  10 mg Oral BID   citalopram  10 mg Oral Daily   clonazePAM  2 mg Oral QHS   potassium chloride  40 mEq Oral Q4H     Data Reviewed:   CBG:  No results for input(s): GLUCAP in the last 168 hours.  SpO2: 98 %    Vitals:  09/01/21 2045 09/02/21 0440 09/02/21 0917 09/02/21 1502  BP: 127/77 123/70 121/74 (!) 96/51  Pulse: 82 81 81 92  Resp: 18 20 20 18   Temp: 98.7 F (37.1 C) 98.5 F (36.9 C) 97.6 F (36.4 C) 97.6 F (36.4 C)  TempSrc: Oral Oral Oral Oral  SpO2: 95% 97% 97% 98%  Weight:      Height:          Data Reviewed:  Basic Metabolic  Panel: Recent Labs  Lab 08/30/21 1252 08/31/21 0500 09/01/21 0441 09/02/21 0548  NA 134* 135 135 134*  K 4.0 4.1 4.0 3.1*  CL 101 103 103 102  CO2 26 24 25 23   GLUCOSE 116* 135* 118* 102*  BUN 29* 26* 25* 20  CREATININE 1.42* 1.52* 1.69* 1.12*  CALCIUM 8.9 8.9 9.0 8.7*    CBC: Recent Labs  Lab 08/30/21 1252 08/31/21 0500 09/01/21 0440 09/02/21 0548  WBC 12.2* 10.5 7.3 6.3  NEUTROABS 10.4*  --  5.7 3.9  HGB 13.8 13.0 12.2 10.9*  HCT 41.7 39.9 36.9 33.1*  MCV 93.9 94.8 93.9 93.0  PLT 157 134* 126* 164    LFT Recent Labs  Lab 08/30/21 1252 08/31/21 0500  AST 68* 39  ALT 54* 40  ALKPHOS 62 59  BILITOT 0.8 0.5  PROT 6.9 6.8  ALBUMIN 3.6 3.2*     Micro-urine culture obtained, result pending    Antibiotics: Anti-infectives (From admission, onward)    Start     Dose/Rate Route Frequency Ordered Stop   09/01/21 1400  amoxicillin (AMOXIL) capsule 500 mg        500 mg Oral Every 8 hours 09/01/21 1157     08/31/21 1000  cefTRIAXone (ROCEPHIN) 1 g in sodium chloride 0.9 % 100 mL IVPB  Status:  Discontinued        1 g 200 mL/hr over 30 Minutes Intravenous Every 24 hours 08/30/21 2106 09/01/21 1157   08/30/21 1245  cefTRIAXone (ROCEPHIN) 1 g in sodium chloride 0.9 % 100 mL IVPB        1 g 200 mL/hr over 30 Minutes Intravenous  Once 08/30/21 1236 08/30/21 1448          DVT prophylaxis: Lovenox  Code Status: Full code  Family Communication: No family at bedside  CONSULTS: Urology    Objective    Physical Examination:  General-appears in no acute distress Heart-S1-S2, regular, no murmur auscultated Lungs-clear to auscultation bilaterally, no wheezing or crackles auscultated Abdomen-soft, nontender, no organomegaly Extremities-no edema in the lower extremities Neuro-alert, oriented x3, no focal deficit noted  Status is: Inpatient sepsis due to UTI;        Oswald Hillock   [facility] If 7PM-7AM, please contact night-coverage at  [email or web address], Office  [phone]   09/02/2021, 3:18 PM  LOS: 3 days

## 2021-09-02 NOTE — Progress Notes (Signed)
°  Subjective: Patient feeling well.  Voiding satisfactorily.  Serum creatinine improved down to 1.12 today.  Hemoglobin continues to fall with hemoglobin being down to 10.9 today.  No passage of clots by the patient.  Objective: Vital signs in last 24 hours: Temp:  [98.5 F (36.9 C)-98.7 F (37.1 C)] 98.5 F (36.9 C) (02/18 0440) Pulse Rate:  [81-82] 81 (02/18 0440) Resp:  [18-20] 20 (02/18 0440) BP: (123-127)/(70-77) 123/70 (02/18 0440) SpO2:  [95 %-97 %] 97 % (02/18 0440)  Intake/Output from previous day: 02/17 0701 - 02/18 0700 In: 1798.9 [P.O.:360; I.V.:1438.9] Out: 375 [Urine:375] Intake/Output this shift: No intake/output data recorded.  Physical Exam:  General: Alert and oriented   Lab Results: Recent Labs    08/31/21 0500 09/01/21 0440 09/02/21 0548  HGB 13.0 12.2 10.9*  HCT 39.9 36.9 33.1*   BMET Recent Labs    09/01/21 0441 09/02/21 0548  NA 135 134*  K 4.0 3.1*  CL 103 102  CO2 25 23  GLUCOSE 118* 102*  BUN 25* 20  CREATININE 1.69* 1.12*  CALCIUM 9.0 8.7*     Studies/Results: CT HEAD WO CONTRAST (5MM)  Result Date: 08/31/2021 CLINICAL DATA:  Headache, new or worsening (Age >= 50y) EXAM: CT HEAD WITHOUT CONTRAST TECHNIQUE: Contiguous axial images were obtained from the base of the skull through the vertex without intravenous contrast. RADIATION DOSE REDUCTION: This exam was performed according to the departmental dose-optimization program which includes automated exposure control, adjustment of the mA and/or kV according to patient size and/or use of iterative reconstruction technique. COMPARISON:  None. FINDINGS: Brain: No evidence of acute infarction, hemorrhage, hydrocephalus, extra-axial collection or mass lesion/mass effect. 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 Partially empty sella. Vascular: No hyperdense vessel identified. Skull: No acute fracture. Sinuses/Orbits: Sinuses  are largely clear. Unremarkable visualized orbits. Other: No mastoid effusions. IMPRESSION: 1. No evidence of acute intracranial abnormality. 2. Partially empty sella, which is often a normal anatomic variant but can be associated with idiopathic intracranial hypertension. Electronically Signed   By: Margaretha Sheffield M.D.   On: 08/31/2021 09:53   US RENAL  Result Date: 09/01/2021 CLINICAL DATA:  Acute kidney injury. EXAM: RENAL / URINARY TRACT ULTRASOUND COMPLETE COMPARISON:  August 30, 2021. FINDINGS: Right Kidney: Renal measurements: 13.9 x 8.9 x 7.3 cm = volume: 455 mL. Echogenicity within normal limits. No mass visualized. Moderate hydronephrosis is now noted which is increased compared to prior exam. Left Kidney: Renal measurements: 11.1 x 6.7 x 6.9 cm = volume: 279 mL. Echogenicity within normal limits. No mass visualized. Moderate hydronephrosis is noted which is increased compared to prior exam Bladder: Appears normal for degree of bladder distention. Other: None. IMPRESSION: Moderate bilateral hydronephrosis is noted which is significantly increased compared to prior exam. Electronically Signed   By: Marijo Conception M.D.   On: 09/01/2021 10:32    Assessment/Plan: Status post bilateral ureteroscopy with subsequent fever and azotemia.  Creatinine improved however hemoglobin continues to drop now down to 10.9.  May be dilutional.  Probably should observe today and recheck hemoglobin in a.m.  If hemoglobin stable would otherwise be ready for discharge home from urologic standpoint with follow-up next week with Dr. Abner Greenspan to recheck hemoglobin and creatinine.    LOS: 3 days   Leslie Fitzgerald 09/02/2021, 8:25 AM

## 2021-09-03 DIAGNOSIS — N132 Hydronephrosis with renal and ureteral calculous obstruction: Secondary | ICD-10-CM | POA: Diagnosis not present

## 2021-09-03 DIAGNOSIS — R519 Headache, unspecified: Secondary | ICD-10-CM | POA: Diagnosis not present

## 2021-09-03 DIAGNOSIS — N179 Acute kidney failure, unspecified: Secondary | ICD-10-CM | POA: Diagnosis not present

## 2021-09-03 DIAGNOSIS — A419 Sepsis, unspecified organism: Secondary | ICD-10-CM | POA: Diagnosis not present

## 2021-09-03 LAB — CBC WITH DIFFERENTIAL/PLATELET
Abs Immature Granulocytes: 0.02 10*3/uL (ref 0.00–0.07)
Basophils Absolute: 0.1 10*3/uL (ref 0.0–0.1)
Basophils Relative: 1 %
Eosinophils Absolute: 0.2 10*3/uL (ref 0.0–0.5)
Eosinophils Relative: 4 %
HCT: 35.3 % — ABNORMAL LOW (ref 36.0–46.0)
Hemoglobin: 11.4 g/dL — ABNORMAL LOW (ref 12.0–15.0)
Immature Granulocytes: 0 %
Lymphocytes Relative: 21 %
Lymphs Abs: 1.2 10*3/uL (ref 0.7–4.0)
MCH: 30.4 pg (ref 26.0–34.0)
MCHC: 32.3 g/dL (ref 30.0–36.0)
MCV: 94.1 fL (ref 80.0–100.0)
Monocytes Absolute: 0.7 10*3/uL (ref 0.1–1.0)
Monocytes Relative: 13 %
Neutro Abs: 3.3 10*3/uL (ref 1.7–7.7)
Neutrophils Relative %: 61 %
Platelets: 196 10*3/uL (ref 150–400)
RBC: 3.75 MIL/uL — ABNORMAL LOW (ref 3.87–5.11)
RDW: 12.8 % (ref 11.5–15.5)
WBC: 5.5 10*3/uL (ref 4.0–10.5)
nRBC: 0 % (ref 0.0–0.2)

## 2021-09-03 LAB — BASIC METABOLIC PANEL
Anion gap: 7 (ref 5–15)
BUN: 25 mg/dL — ABNORMAL HIGH (ref 6–20)
CO2: 23 mmol/L (ref 22–32)
Calcium: 8.9 mg/dL (ref 8.9–10.3)
Chloride: 110 mmol/L (ref 98–111)
Creatinine, Ser: 0.97 mg/dL (ref 0.44–1.00)
GFR, Estimated: >60 mL/min (ref >60)
Glucose, Bld: 109 mg/dL — ABNORMAL HIGH (ref 70–99)
Potassium: 4.6 mmol/L (ref 3.5–5.1)
Sodium: 140 mmol/L (ref 135–145)

## 2021-09-03 MED ORDER — AMOXICILLIN 500 MG PO CAPS: 500.0000 mg | ORAL_CAPSULE | Freq: Three times a day (TID) | ORAL | 0 refills | Status: AC

## 2021-09-03 MED ORDER — BUTALBITAL-APAP-CAFFEINE 50-325-40 MG PO TABS: 1.0000 | ORAL_TABLET | Freq: Four times a day (QID) | ORAL | 0 refills | Status: AC | PRN

## 2021-09-03 NOTE — Plan of Care (Signed)
?  Problem: Clinical Measurements: ?Goal: Ability to maintain clinical measurements within normal limits will improve ?Outcome: Progressing ?Goal: Will remain free from infection ?Outcome: Progressing ?Goal: Diagnostic test results will improve ?Outcome: Progressing ?  ?

## 2021-09-03 NOTE — Plan of Care (Signed)
°  Problem: Education: Goal: Knowledge of General Education information will improve Description: Including pain rating scale, medication(s)/side effects and non-pharmacologic comfort measures Outcome: Adequate for Discharge   Problem: Health Behavior/Discharge Planning: Goal: Ability to manage health-related needs will improve Outcome: Adequate for Discharge   Problem: Clinical Measurements: Goal: Ability to maintain clinical measurements within normal limits will improve 09/03/2021 1045 by Lennie Hummer, RN Outcome: Adequate for Discharge 09/03/2021 1040 by Lennie Hummer, RN Outcome: Progressing Goal: Will remain free from infection 09/03/2021 1045 by Lennie Hummer, RN Outcome: Adequate for Discharge 09/03/2021 1040 by Lennie Hummer, RN Outcome: Progressing Goal: Diagnostic test results will improve 09/03/2021 1045 by Lennie Hummer, RN Outcome: Adequate for Discharge 09/03/2021 1040 by Lennie Hummer, RN Outcome: Progressing

## 2021-09-03 NOTE — Progress Notes (Addendum)
°  Subjective: Patient feeling well.  Voiding satisfactorily.  Serum creatinine improved down to 0.97 today.  Hemoglobin stable today at 11.4.  Continues to void spontaneously with clear urine. Objective: Vital signs in last 24 hours: Temp:  [97.6 F (36.4 C)-97.9 F (36.6 C)] 97.9 F (36.6 C) (02/19 0523) Pulse Rate:  [81-92] 87 (02/19 0523) Resp:  [18-20] 18 (02/19 0523) BP: (92-121)/(51-74) 117/70 (02/19 0523) SpO2:  [96 %-98 %] 97 % (02/19 0523)  Intake/Output from previous day: 02/18 0701 - 02/19 0700 In: 940 [P.O.:900; I.V.:40] Out: -  Intake/Output this shift: No intake/output data recorded.  Physical Exam:  General: Alert and oriented   Lab Results: Recent Labs    09/01/21 0440 09/02/21 0548 09/03/21 0508  HGB 12.2 10.9* 11.4*  HCT 36.9 33.1* 35.3*    BMET Recent Labs    09/02/21 0548 09/03/21 0508  NA 134* 140  K 3.1* 4.6  CL 102 110  CO2 23 23  GLUCOSE 102* 109*  BUN 20 25*  CREATININE 1.12* 0.97  CALCIUM 8.7* 8.9      Studies/Results: US RENAL  Result Date: 09/01/2021 CLINICAL DATA:  Acute kidney injury. EXAM: RENAL / URINARY TRACT ULTRASOUND COMPLETE COMPARISON:  August 30, 2021. FINDINGS: Right Kidney: Renal measurements: 13.9 x 8.9 x 7.3 cm = volume: 455 mL. Echogenicity within normal limits. No mass visualized. Moderate hydronephrosis is now noted which is increased compared to prior exam. Left Kidney: Renal measurements: 11.1 x 6.7 x 6.9 cm = volume: 279 mL. Echogenicity within normal limits. No mass visualized. Moderate hydronephrosis is noted which is increased compared to prior exam Bladder: Appears normal for degree of bladder distention. Other: None. IMPRESSION: Moderate bilateral hydronephrosis is noted which is significantly increased compared to prior exam. Electronically Signed   By: Marijo Conception M.D.   On: 09/01/2021 10:32    Assessment/Plan: Status post bilateral ureteroscopy with subsequent fever and azotemia.  Creatinine  improved and hemoglobin is stable today.  Okay for discharge home from urologic standpoint with follow-up with Dr. Abner Greenspan in the outpatient setting.    LOS: 4 days   Yousef Abu-Salha 09/03/2021, 8:56 AM   I performed a history and physical examination of the patient and discussed the patients management with the resident.  I reviewed the resident's note and agree with the documented findings and plan of care.

## 2021-09-03 NOTE — Discharge Summary (Signed)
Physician Discharge Summary   Patient: Leslie Fitzgerald MRN: [id number] DOB: 11-22-1965  Admit date:     08/30/2021  Discharge date: 09/03/21  Discharge Physician: Oswald Hillock   PCP: Jettie Booze, NP   Recommendations at discharge:   Follow-up with urology as outpatient Follow-up PCP in 1 week  Discharge Diagnoses: Principal Problem:   Sepsis ([place]) Active Problems:   Ureteral calculi   Ureteral stone with hydronephrosis   UTI (urinary tract infection)   Headache   Hyperlipidemia   AKI (acute kidney injury) ([place])   Hypokalemia  Resolved Problems:   * No resolved hospital problems. *   Hospital Course:  56 y.o. female, with history of kidney stone, ureteral stone, headaches came to hospital from urologist office for fever, generalized body aches and headache.  Patient had bilateral ureteral stents placed on 08/28/2021 for a 7 mm left distal ureteral stone which was identified on 08/04/2021.  She also had 5 mm right lower pole stone which was dusted with lithotripsy. Patient says that she developed fever yesterday which was as high as 69 F, she also developed headache last night.  Denies dysuria, frequency of urination, complains of hematuria, denies flank pain or abdominal pain.  No nausea vomiting or diarrhea.  Denies chest pain or shortness of breath.  Denies cough.  Denies sore throat.  No neck pain or stiffness. She was seen at [facility] urology office where she was found to have abnormal UA and SBP was in 60s as per ED notes.  She was sent to ED for further evaluation for sepsis  Assessment and Plan: * Sepsis ([place])- (present on admission) - presented with fever, tachycardia -Was found to have abnormal UA -She was hypotensive on presentation, started on 30 cc/kg IV fluid bolus, which was changed to LR at 100 mL/h -Sepsis physiology has resolved -Patient empirically started on IV Rocephin; IV Rocephin was discontinued patient started on amoxicillin to cover for  Enterococcus -We will discharge amoxicillin for 3 more days to complete 7 days of treatment  Hypokalemia -Replete  AKI (acute kidney injury) ([place]) - Patient presented with creatinine of 1.42 -Creatinine today has improved to 0.97 -Renal ultrasound shows worsening bilateral hydronephrosis -Urology has cleared for discharge   Hyperlipidemia - Patient takes Crestor at home -Crestor on hold in the hospital due to elevated LFTs -LFTs have improved  Headache - Patient presented with headache -No focal deficit; she has history of migraines in the past -CT head obtained shows partial empty sella which could be normal variant versus idiopathic intracranial hypertension -Started on  Fioricet 1 tablet p.o. every 6 hours as needed for headache. -Called and discussed with neurology, Dr. Rory Percy, who recommends treatment for migraine.  Highly unlikely that patient has idiopathic intracranial hypertension without visual symptoms -Headache significantly improved after she received Toradol -At this time she denies headache, will discharge on Fioricet as needed  UTI (urinary tract infection)- (present on admission) - Patient found to have abnormal UA -Started on IV Rocephin -Urine culture grew Enterococcus faecalis; IV Rocephin changed to p.o. amoxicillin -Discussed with urology, will give 3 more days of amoxicillin to complete 7 days of treatment  Ureteral stone with hydronephrosis- (present on admission) - Patient is status post bilateral ureteral stent placement -She accidentally pulled out 1 stent and then removed other stent on 08/31/2021 -Renal ultrasound shows worsening bilateral hydronephrosist -Urology has cleared her for discharge -They will follow-up as outpatient           Consultants:  Urology Procedures performed: None Disposition: Home Diet recommendation:  Discharge Diet Orders (From admission, onward)     Start     Ordered   09/03/21 0000  Diet - low sodium heart  healthy        09/03/21 1015           Regular diet  DISCHARGE MEDICATION: Allergies as of 09/03/2021   No Known Allergies      Medication List     STOP taking these medications    cephALEXin 500 MG capsule Commonly known as: KEFLEX   ibuprofen 200 MG tablet Commonly known as: ADVIL   oxyCODONE-acetaminophen 5-325 MG tablet Commonly known as: PERCOCET/ROXICET       TAKE these medications    amoxicillin 500 MG capsule Commonly known as: AMOXIL Take 1 capsule (500 mg total) by mouth 3 (three) times daily.   aspirin EC 81 MG tablet Take 81 mg by mouth daily as needed for mild pain (headaches). Swallow whole.   busPIRone 10 MG tablet Commonly known as: BUSPAR Take 10 mg by mouth 2 (two) times daily.   butalbital-acetaminophen-caffeine 50-325-40 MG tablet Commonly known as: FIORICET Take 1 tablet by mouth every 6 (six) hours as needed for headache or migraine.   citalopram 10 MG tablet Commonly known as: CELEXA Take 10 mg by mouth daily.   clonazePAM 1 MG tablet Commonly known as: KLONOPIN Take 2 mg by mouth at bedtime.   docusate sodium 100 MG capsule Commonly known as: Colace Take 1 capsule (100 mg total) by mouth daily as needed. What changed: reasons to take this   multivitamin with minerals tablet Take 1 tablet by mouth daily.   rosuvastatin 10 MG tablet Commonly known as: CRESTOR Take 10 mg by mouth at bedtime.         Discharge Exam: Filed Weights   08/30/21 2240 08/31/21 2115  Weight: 86.5 kg 88 kg   General-appears in no acute distress Heart-S1-S2, regular, no murmur auscultated Lungs-clear to auscultation bilaterally, no wheezing or crackles auscultated Abdomen-soft, nontender, no organomegaly Extremities-no edema in the lower extremities Neuro-alert, oriented x3, no focal deficit noted  Condition at discharge: good  The results of significant diagnostics from this hospitalization (including imaging, microbiology, ancillary  and laboratory) are listed below for reference.   Imaging Studies: DG Abdomen 1 View  Result Date: 08/30/2021 CLINICAL DATA:  Assess stent placement. EXAM: ABDOMEN - 1 VIEW COMPARISON:  Abdominal x-ray from same day. Intraoperative x-rays dated August 28, 2021. FINDINGS: Bilateral double-J ureteral stents are appropriately positioned. No visible renal calculi by x-ray. Unchanged phleboliths in the pelvis. The bowel gas pattern is normal. IMPRESSION: 1. Bilateral double-J ureteral stents are appropriately positioned. Electronically Signed   By: Titus Dubin M.D.   On: 08/30/2021 13:58   CT HEAD WO CONTRAST (5MM)  Result Date: 08/31/2021 CLINICAL DATA:  Headache, new or worsening (Age >= 50y) EXAM: CT HEAD WITHOUT CONTRAST TECHNIQUE: Contiguous axial images were obtained from the base of the skull through the vertex without intravenous contrast. RADIATION DOSE REDUCTION: This exam was performed according to the departmental dose-optimization program which includes automated exposure control, adjustment of the mA and/or kV according to patient size and/or use of iterative reconstruction technique. COMPARISON:  None. FINDINGS: Brain: No evidence of acute infarction, hemorrhage, hydrocephalus, extra-axial collection or mass lesion/mass effect. 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 Partially empty sella. Vascular: No hyperdense vessel identified. Skull: No acute fracture. Sinuses/Orbits: Sinuses are largely clear.  Unremarkable visualized orbits. Other: No mastoid effusions. IMPRESSION: 1. No evidence of acute intracranial abnormality. 2. Partially empty sella, which is often a normal anatomic variant but can be associated with idiopathic intracranial hypertension. Electronically Signed   By: Margaretha Sheffield M.D.   On: 08/31/2021 09:53   US RENAL  Result Date: 09/01/2021 CLINICAL DATA:  Acute kidney injury. EXAM: RENAL / URINARY  TRACT ULTRASOUND COMPLETE COMPARISON:  August 30, 2021. FINDINGS: Right Kidney: Renal measurements: 13.9 x 8.9 x 7.3 cm = volume: 455 mL. Echogenicity within normal limits. No mass visualized. Moderate hydronephrosis is now noted which is increased compared to prior exam. Left Kidney: Renal measurements: 11.1 x 6.7 x 6.9 cm = volume: 279 mL. Echogenicity within normal limits. No mass visualized. Moderate hydronephrosis is noted which is increased compared to prior exam Bladder: Appears normal for degree of bladder distention. Other: None. IMPRESSION: Moderate bilateral hydronephrosis is noted which is significantly increased compared to prior exam. Electronically Signed   By: Marijo Conception M.D.   On: 09/01/2021 10:32   US RENAL  Result Date: 08/30/2021 CLINICAL DATA:  Bilateral flank pain. Recent ureteral stent placement. EXAM: RENAL / URINARY TRACT ULTRASOUND COMPLETE COMPARISON:  Radiographs 08/30/2021, CT 08/08/2021 and renal ultrasound 07/24/2012. FINDINGS: Right Kidney: Renal measurements: 13.2 x 7.1 x 6.8 cm = volume: 336.1 mL. Multiple nonobstructing renal calculi are demonstrated. Mild prominence of the renal pelvis without hydronephrosis. The ureteral stent is not visualized within the right renal pelvis. Left Kidney: Renal measurements: 11.5 x 5.0 x 5.3 cm = volume: 159.4 mL. Multiple nonobstructing renal calculi are demonstrated. There is diffuse cortical thinning and scarring. Mild prominence of the renal pelvis without hydronephrosis. The ureteral stent is not visualized within the left renal pelvis. Bladder: Partially decompressed, likely accounting for mild bladder wall thickening. The distal aspects of the ureteral stents are visualized. Other: Small renal cysts are noted. IMPRESSION: 1. Mild prominence of both renal pelves following ureteral stent placement. No hydronephrosis. 2. Nonobstructing bilateral renal calculi with left renal cortical scarring. Electronically Signed   By: Richardean Sale M.D.   On: 08/30/2021 13:49   DG Chest Port 1 View  Result Date: 08/30/2021 CLINICAL DATA:  Fever and fatigue since yesterday. Recent ureteral stent placement 2 days ago. EXAM: PORTABLE CHEST 1 VIEW COMPARISON:  None. FINDINGS: The heart size and mediastinal contours are within normal limits. Both lungs are clear. The visualized skeletal structures are unremarkable. IMPRESSION: No active disease. Electronically Signed   By: Titus Dubin M.D.   On: 08/30/2021 12:49   DG C-Arm 1-60 Min-No Report  Result Date: 08/28/2021 Fluoroscopy was utilized by the requesting physician.  No radiographic interpretation.   DG C-Arm 1-60 Min-No Report  Result Date: 08/28/2021 Fluoroscopy was utilized by the requesting physician.  No radiographic interpretation.    Microbiology: Results for orders placed or performed during the hospital encounter of 08/30/21  Resp Panel by RT-PCR (Flu A&B, Covid) Nasopharyngeal Swab     Status: None   Collection Time: 08/30/21 12:54 PM   Specimen: Nasopharyngeal Swab; Nasopharyngeal(NP) swabs in vial transport medium  Result Value Ref Range Status   SARS Coronavirus 2 by RT PCR NEGATIVE NEGATIVE Final    Comment: (NOTE) SARS-CoV-2 target nucleic acids are NOT DETECTED.  The SARS-CoV-2 RNA is generally detectable in upper respiratory specimens during the acute phase of infection. The lowest concentration of SARS-CoV-2 viral copies this assay can detect is 138 copies/mL. A negative result does not preclude SARS-Cov-2 infection  and should not be used as the sole basis for treatment or other patient management decisions. A negative result may occur with  improper specimen collection/handling, submission of specimen other than nasopharyngeal swab, presence of viral mutation(s) within the areas targeted by this assay, and inadequate number of viral copies(<138 copies/mL). A negative result must be combined with clinical observations, patient history, and  epidemiological information. The expected result is Negative.  Fact Sheet for Patients:  EntrepreneurPulse.com.au  Fact Sheet for Healthcare Providers:  IncredibleEmployment.be  This test is no t yet approved or cleared by the [place] FDA and  has been authorized for detection and/or diagnosis of SARS-CoV-2 by FDA under an Emergency Use Authorization (EUA). This EUA will remain  in effect (meaning this test can be used) for the duration of the COVID-19 declaration under Section 564(b)(1) of the Act, 21 U.S.C.section 360bbb-3(b)(1), unless the authorization is terminated  or revoked sooner.       Influenza A by PCR NEGATIVE NEGATIVE Final   Influenza B by PCR NEGATIVE NEGATIVE Final    Comment: (NOTE) The Xpert Xpress SARS-CoV-2/FLU/RSV plus assay is intended as an aid in the diagnosis of influenza from Nasopharyngeal swab specimens and should not be used as a sole basis for treatment. Nasal washings and aspirates are unacceptable for Xpert Xpress SARS-CoV-2/FLU/RSV testing.  Fact Sheet for Patients: EntrepreneurPulse.com.au  Fact Sheet for Healthcare Providers: IncredibleEmployment.be  This test is not yet approved or cleared by the [place] FDA and has been authorized for detection and/or diagnosis of SARS-CoV-2 by FDA under an Emergency Use Authorization (EUA). This EUA will remain in effect (meaning this test can be used) for the duration of the COVID-19 declaration under Section 564(b)(1) of the Act, 21 U.S.C. section 360bbb-3(b)(1), unless the authorization is terminated or revoked.  Performed at [facility], [place] [address], [place], [place] [address]   Blood Culture (routine x 2)     Status: None (Preliminary result)   Collection Time: 08/30/21 12:55 PM   Specimen: BLOOD  Result Value Ref Range Status   Specimen Description   Final    BLOOD LEFT  ANTECUBITAL Performed at [facility] [address] [place], [place] [address]    Special Requests   Final    BOTTLES DRAWN AEROBIC AND ANAEROBIC Blood Culture adequate volume Performed at [place] [address], [place], [place] [address]    Culture   Final    NO GROWTH 4 DAYS Performed at [facility], [place] [address] [place], [place] [address]    Report Status PENDING  Incomplete  Blood Culture (routine x 2)     Status: None (Preliminary result)   Collection Time: 08/30/21  1:06 PM   Specimen: BLOOD  Result Value Ref Range Status   Specimen Description   Final    BLOOD RIGHT ANTECUBITAL Performed at [facility] [address] [place], [place] [address]    Special Requests   Final    BOTTLES DRAWN AEROBIC AND ANAEROBIC Blood Culture adequate volume Performed at [facility] [address] [place], [place] [address]    Culture   Final    NO GROWTH 4 DAYS Performed at [facility], [place] [address] [place],  [address]    Report Status PENDING  Incomplete  Urine Culture     Status: Abnormal   Collection Time: 08/30/21  3:22 PM   Specimen: In/Out Cath Urine  Result  Value Ref Range Status   Specimen Description   Final    IN/OUT CATH URINE Performed at [place] [address] [place], [place] [address]    Special Requests   Final    NONE Performed at [facility], [place] [address] [place], [place] [address]    Culture 1,000 COLONIES/mL ENTEROCOCCUS FAECALIS (A)  Final   Report Status 09/02/2021 FINAL  Final   Organism ID, Bacteria ENTEROCOCCUS FAECALIS (A)  Final      Susceptibility   Enterococcus faecalis - MIC*    AMPICILLIN <=2 SENSITIVE Sensitive     NITROFURANTOIN <=16 SENSITIVE Sensitive     VANCOMYCIN 1 SENSITIVE Sensitive     * 1,000 COLONIES/mL ENTEROCOCCUS FAECALIS    Labs: CBC: Recent Labs  Lab  08/30/21 1252 08/31/21 0500 09/01/21 0440 09/02/21 0548 09/03/21 0508  WBC 12.2* 10.5 7.3 6.3 5.5  NEUTROABS 10.4*  --  5.7 3.9 3.3  HGB 13.8 13.0 12.2 10.9* 11.4*  HCT 41.7 39.9 36.9 33.1* 35.3*  MCV 93.9 94.8 93.9 93.0 94.1  PLT 157 134* 126* 164 270   Basic Metabolic Panel: Recent Labs  Lab 08/30/21 1252 08/31/21 0500 09/01/21 0441 09/02/21 0548 09/03/21 0508  NA 134* 135 135 134* 140  K 4.0 4.1 4.0 3.1* 4.6  CL 101 103 103 102 110  CO2 26 24 25 23 23   GLUCOSE 116* 135* 118* 102* 109*  BUN 29* 26* 25* 20 25*  CREATININE 1.42* 1.52* 1.69* 1.12* 0.97  CALCIUM 8.9 8.9 9.0 8.7* 8.9   Liver Function Tests: Recent Labs  Lab 08/30/21 1252 08/31/21 0500  AST 68* 39  ALT 54* 40  ALKPHOS 62 59  BILITOT 0.8 0.5  PROT 6.9 6.8  ALBUMIN 3.6 3.2*   CBG: No results for input(s): GLUCAP in the last 168 hours.  Discharge time spent: greater than 30 minutes.  Signed: Oswald Hillock, MD [facility] 09/03/2021

## 2021-09-04 LAB — CULTURE, BLOOD (ROUTINE X 2)
Culture: NO GROWTH
Culture: NO GROWTH
Special Requests: ADEQUATE
Special Requests: ADEQUATE

## 2023-07-05 IMAGING — CT CT HEAD W/O CM
3 series · 15 of 47 positions shown, 18 images · non-contrast
Comparison: None.

CLINICAL DATA: Headache, new or worsening (Age >= 50y)



[Series 2: head wo · axial · 0.47mm/px · z∈[-160,-35]mm · 9 of 31 slices shown, 12 images]
[im 3/31  brain]
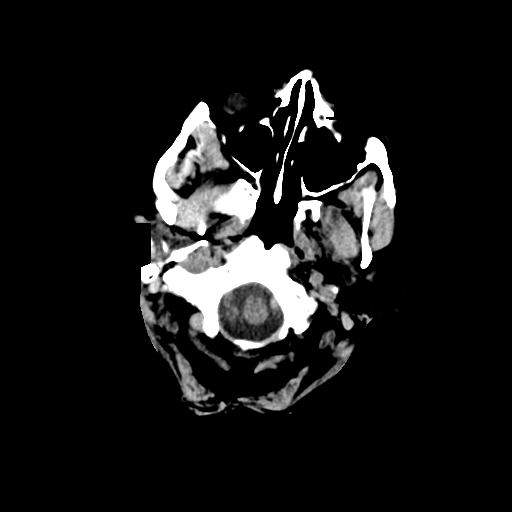
[im 3/31  bone]
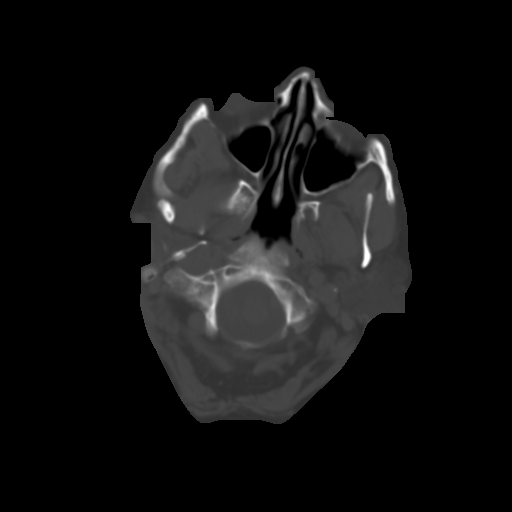
[im 6/31  brain]
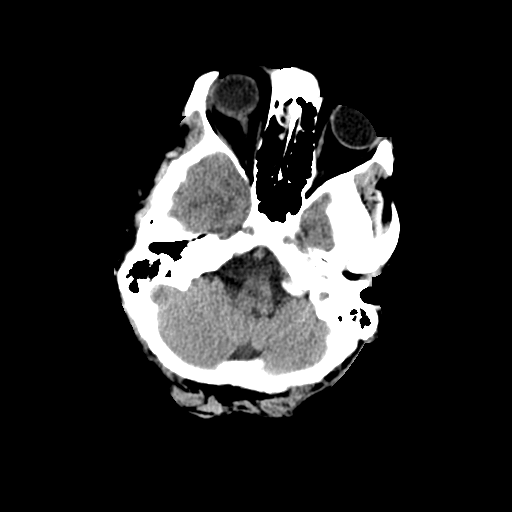
[im 9/31  brain]
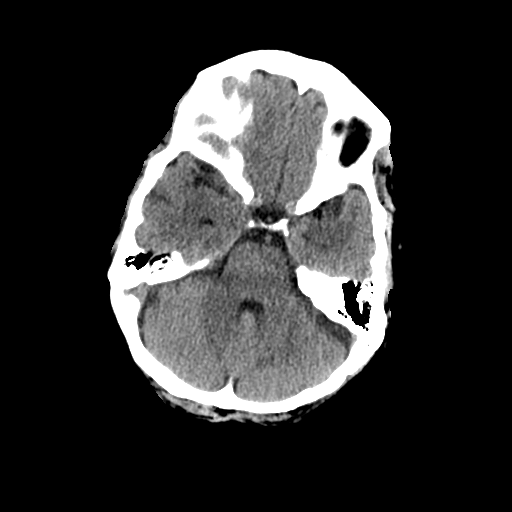
[im 12/31  brain]
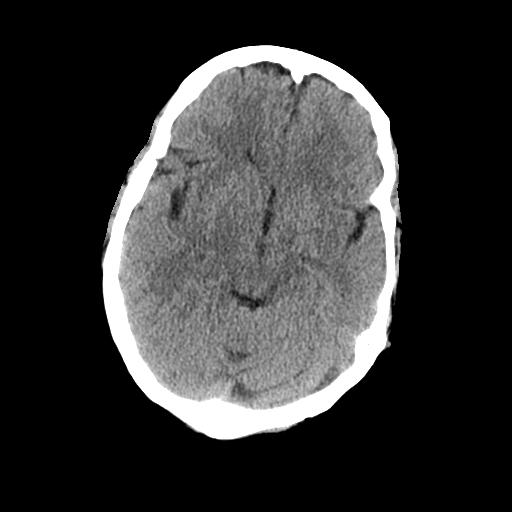
[im 16/31  brain]
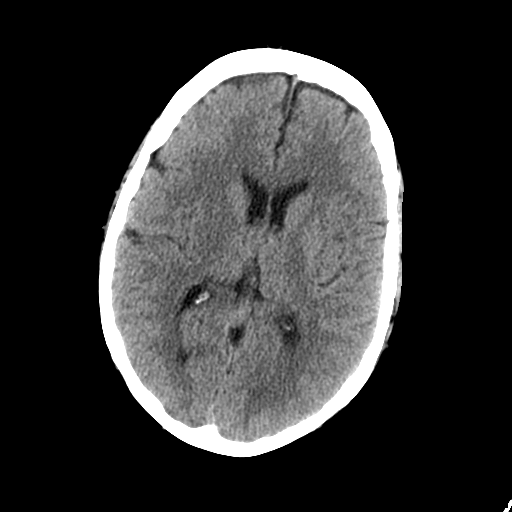
[im 16/31  bone]
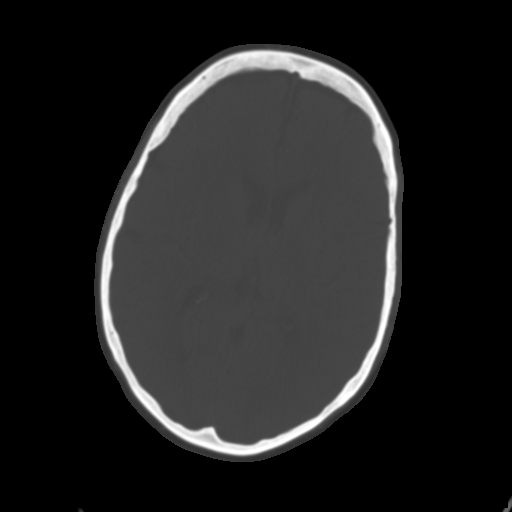
[im 19/31  brain]
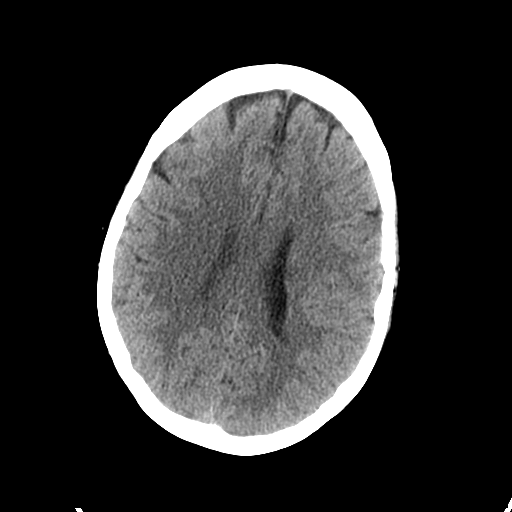
[im 22/31  brain]
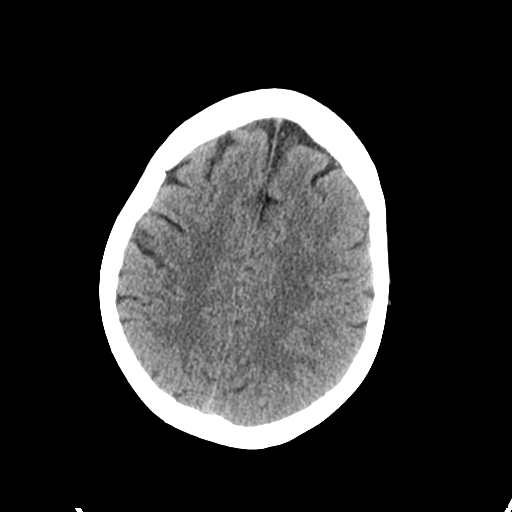
[im 25/31  brain]
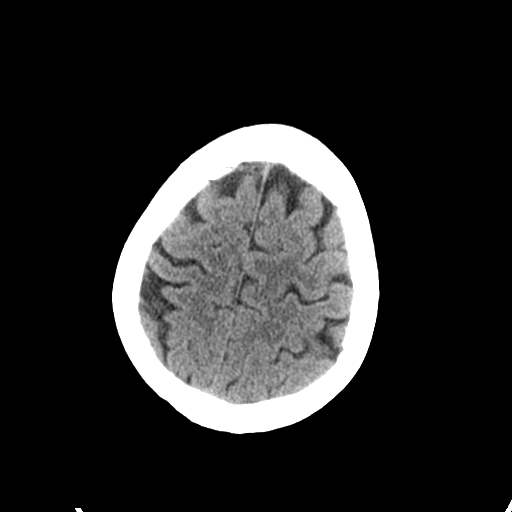
[im 28/31  brain]
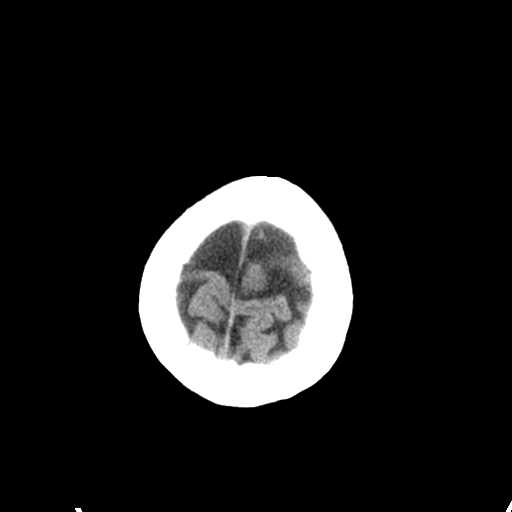
[im 28/31  bone]
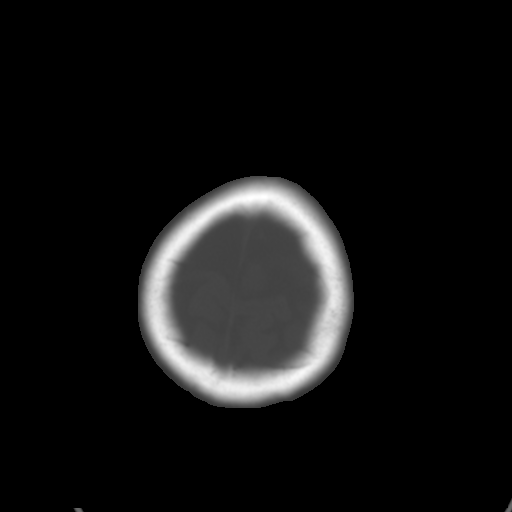

[Series 5: coronal soft tissue · coronal · 0.30mm/px · 3 of 70 slices shown]
[im 24/70  brain]
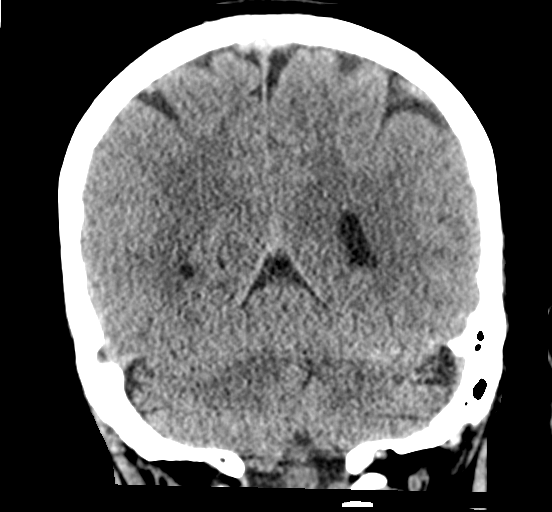
[im 31/70  brain]
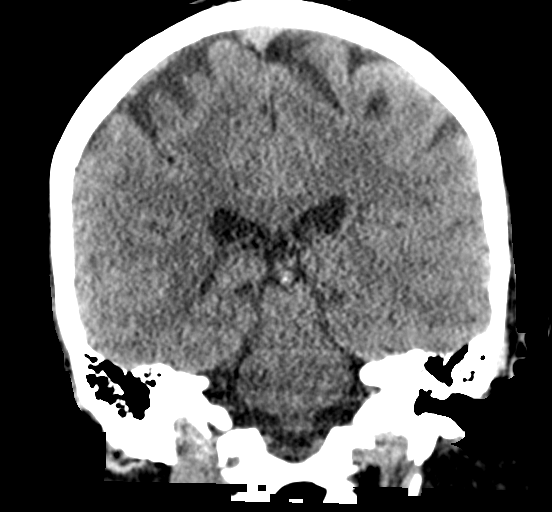
[im 39/70  brain]
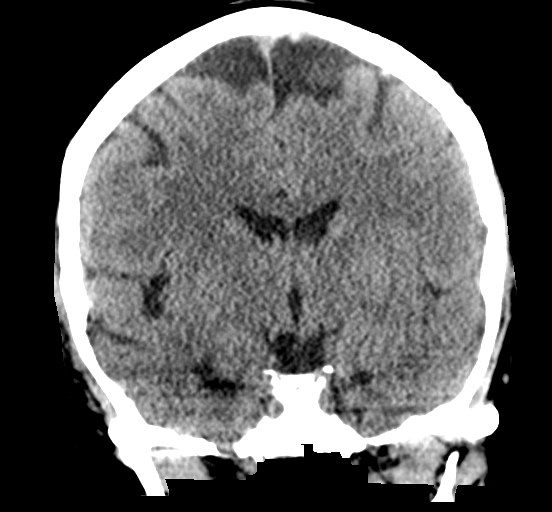

[Series 6: sagittal soft tissue · sagittal · 0.30mm/px · 3 of 56 slices shown]
[im 19/56  brain]
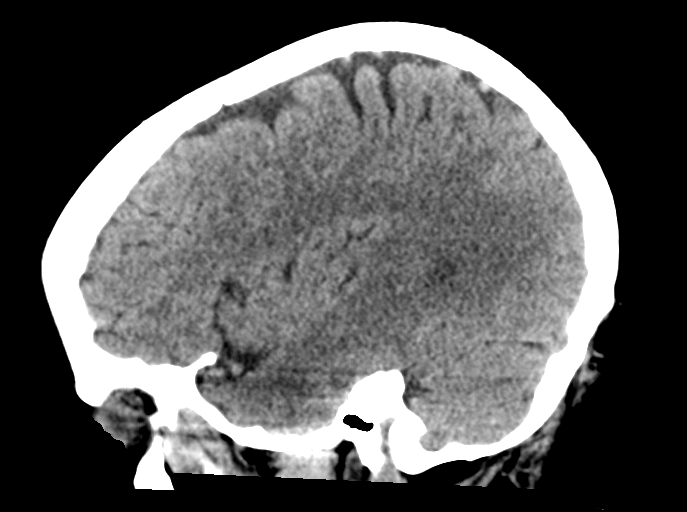
[im 28/56  brain]
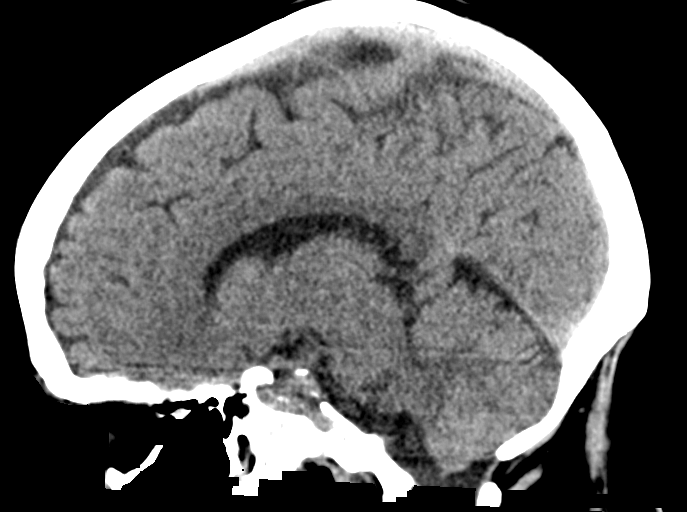
[im 37/56  brain]
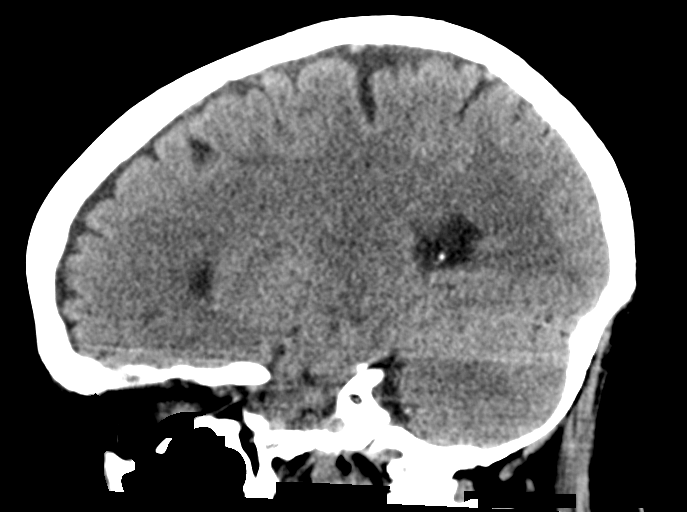

[15 of 47 positions shown; findings below may reference images not displayed]

FINDINGS: Brain: No evidence of acute infarction, hemorrhage, hydrocephalus,
extra-axial collection or mass lesion/mass effect. Mild/subtle
patchy white matter hypoattenuation, nonspecific but likely chronic
microvascular ischemic disease. Partially empty sella.

Vascular: No hyperdense vessel identified.

Skull: No acute fracture.

Sinuses/Orbits: Sinuses are largely clear. Unremarkable visualized
orbits.

Other: No mastoid effusions.
IMPRESSION: 1. No evidence of acute intracranial abnormality.
2. Partially empty sella, which is often a normal anatomic variant
but can be associated with idiopathic intracranial hypertension.

## 2023-07-06 IMAGING — US US RENAL
1 series · 15 of 25 positions shown · non-contrast
Comparison: August 30, 2021.

CLINICAL DATA: Acute kidney injury.

EXAM:
RENAL / URINARY TRACT ULTRASOUND COMPLETE

[Series 1: us renal mc & wl · 15 of 49 slices shown]
[im 1/49]
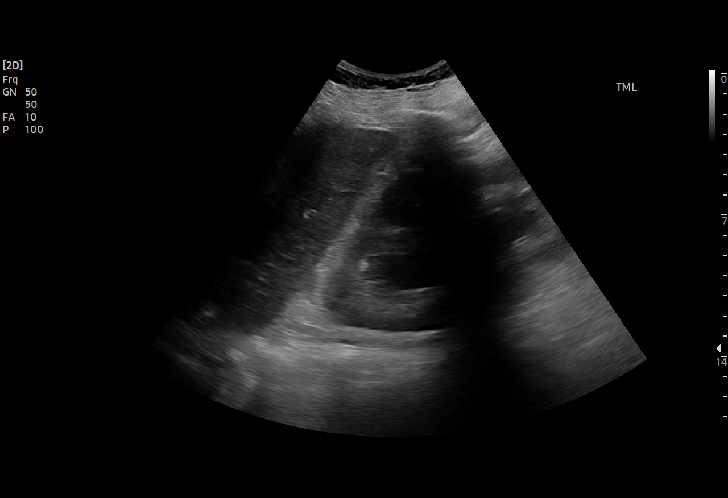
[im 5/49]
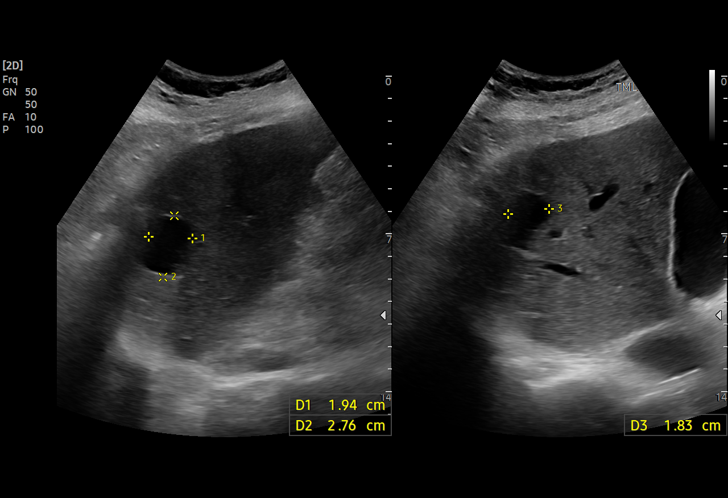
[im 9/49]
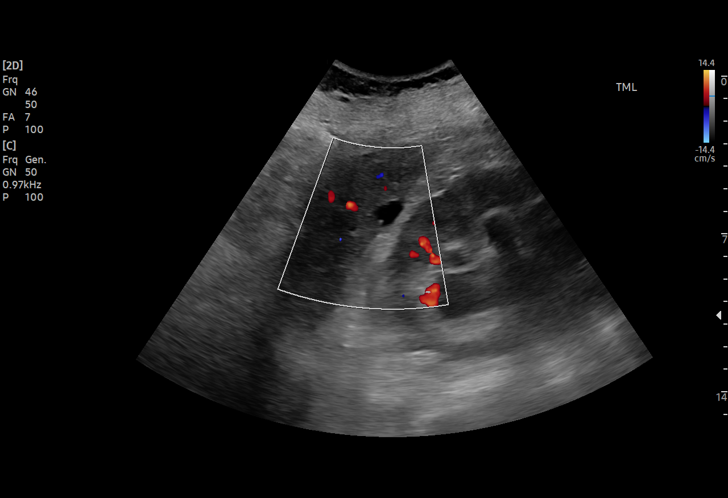
[im 11/49]
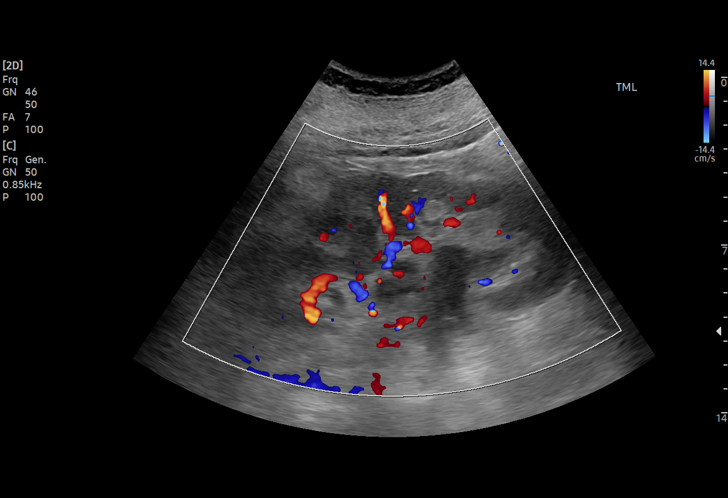
[im 15/49]
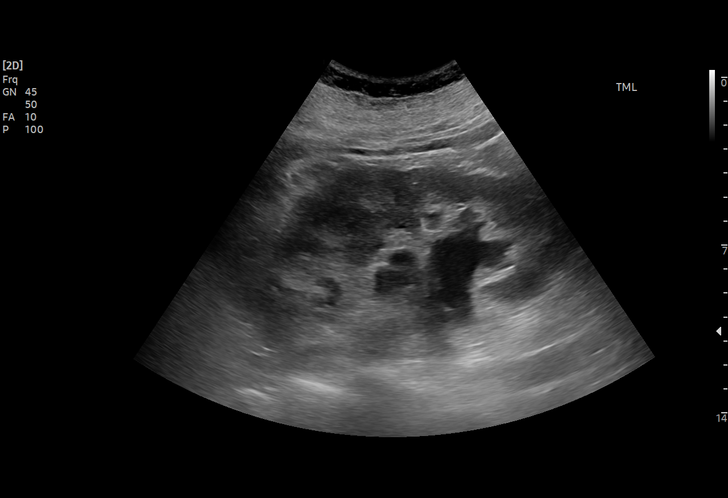
[im 19/49]
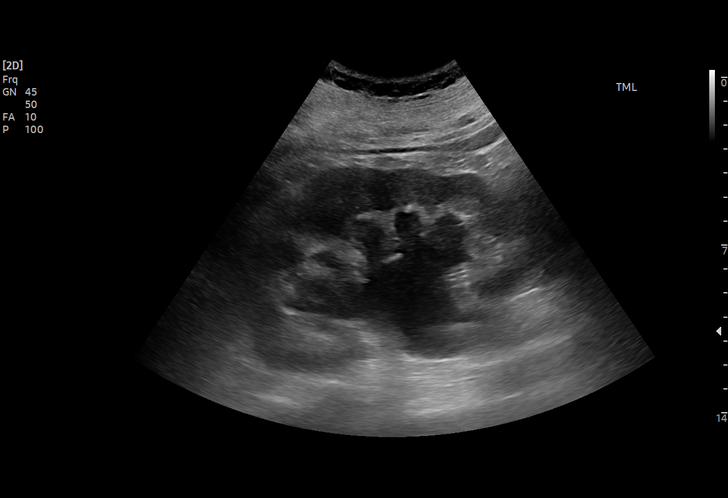
[im 21/49]
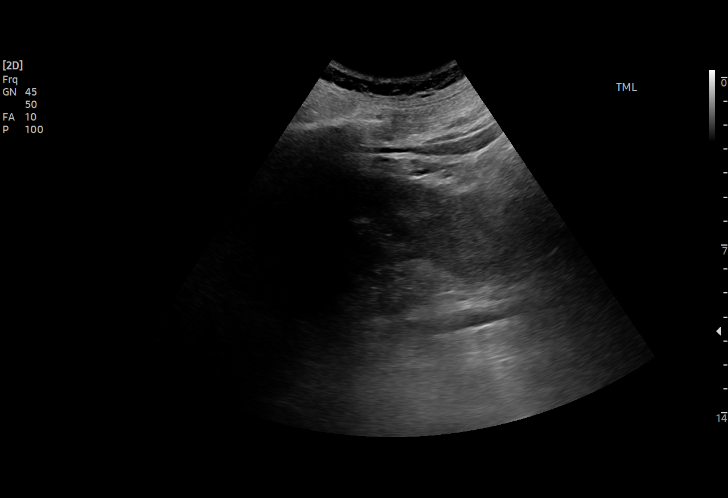
[im 25/49]
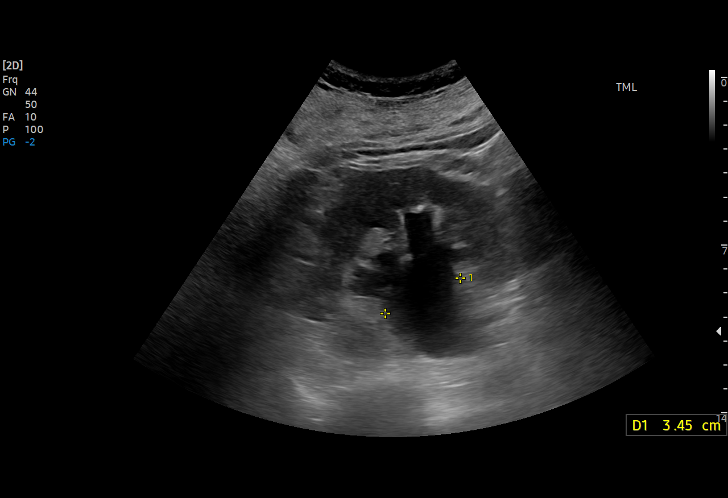
[im 29/49]
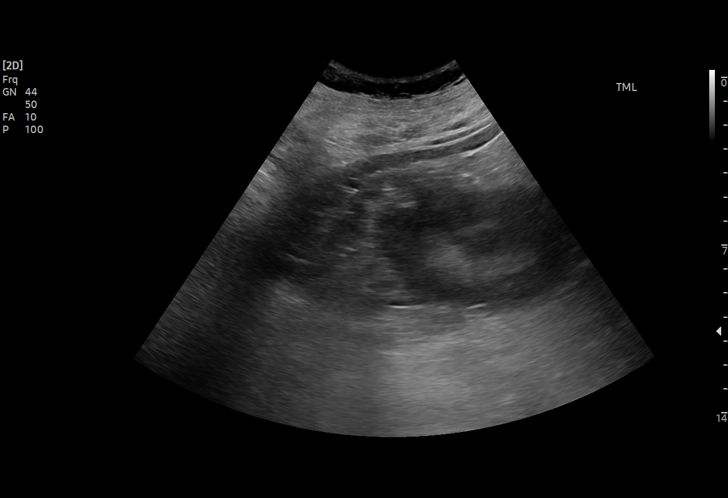
[im 31/49]
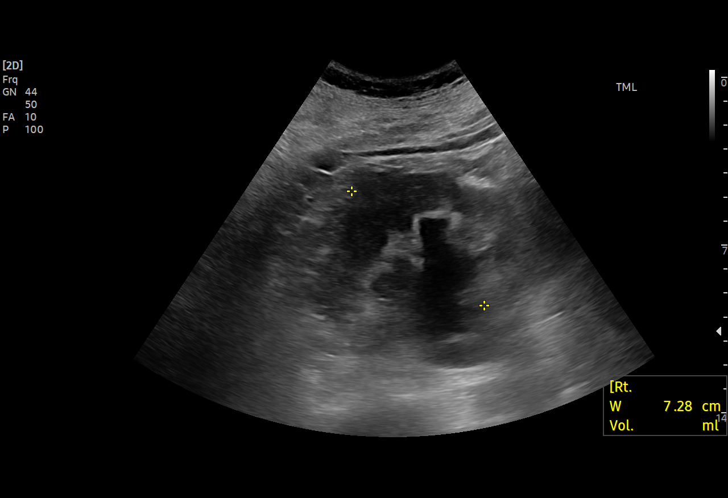
[im 35/49]
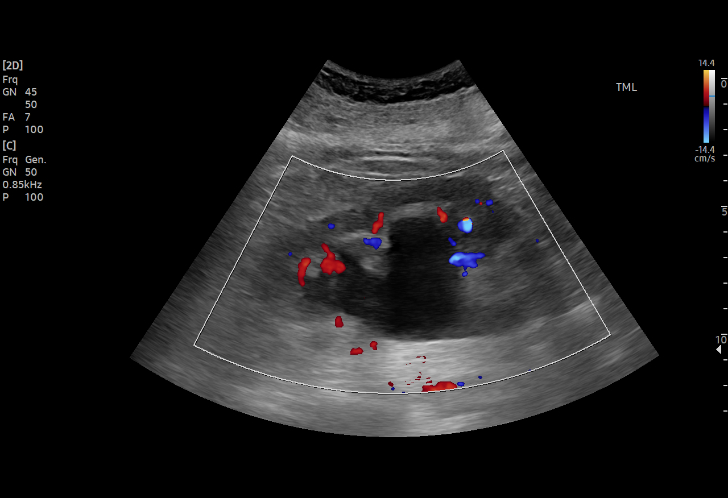
[im 39/49]
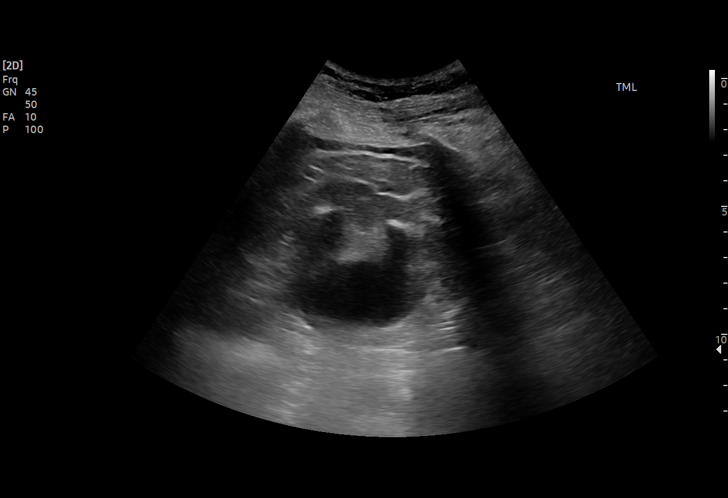
[im 41/49]
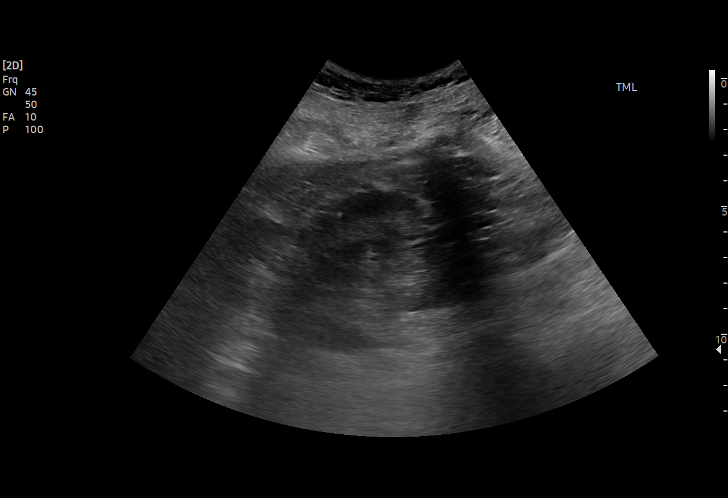
[im 45/49]
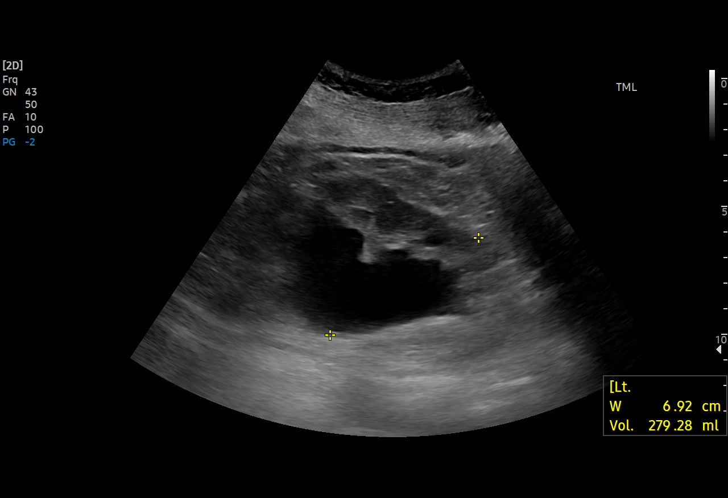
[im 49/49]
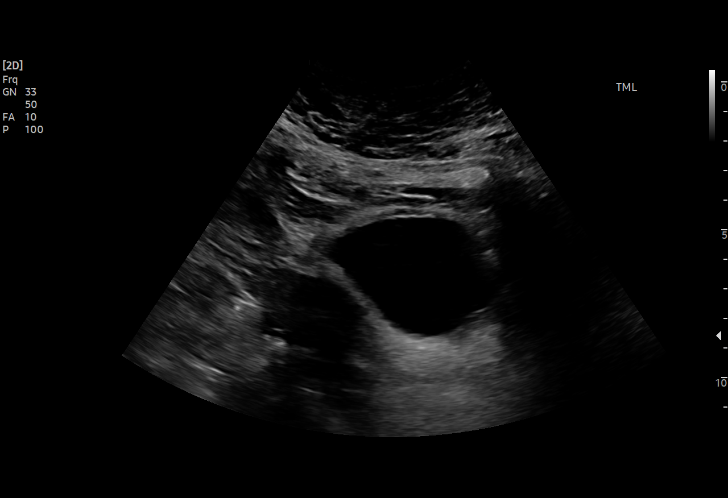

[15 of 25 positions shown; findings below may reference images not displayed]

FINDINGS: Right Kidney:

Renal measurements: 13.9 x 8.9 x 7.3 cm = volume: 455 mL.
Echogenicity within normal limits. No mass visualized. Moderate
hydronephrosis is now noted which is increased compared to prior
exam.

Left Kidney:

Renal measurements: 11.1 x 6.7 x 6.9 cm = volume: 279 mL.
Echogenicity within normal limits. No mass visualized. Moderate
hydronephrosis is noted which is increased compared to prior exam

Bladder:

Appears normal for degree of bladder distention.

Other:

None.
IMPRESSION: Moderate bilateral hydronephrosis is noted which is significantly
increased compared to prior exam.

## 2023-10-24 ENCOUNTER — Other Ambulatory Visit: Payer: Self-pay | Admitting: Obstetrics and Gynecology

## 2023-10-24 DIAGNOSIS — Z1231 Encounter for screening mammogram for malignant neoplasm of breast: Secondary | ICD-10-CM

## 2023-12-31 ENCOUNTER — Ambulatory Visit

## 2024-06-16 ENCOUNTER — Ambulatory Visit
Admission: RE | Admit: 2024-06-16 | Discharge: 2024-06-16 | Disposition: A | Source: Ambulatory Visit | Attending: Obstetrics and Gynecology | Admitting: Obstetrics and Gynecology

## 2024-06-16 DIAGNOSIS — Z1231 Encounter for screening mammogram for malignant neoplasm of breast: Secondary | ICD-10-CM

## 2024-08-18 ENCOUNTER — Encounter: Payer: Self-pay | Admitting: Gastroenterology
